# Patient Record
Sex: Female | Born: 1935 | Race: Black or African American | Hispanic: No | Smoking: Never smoker
Health system: Southern US, Community
[De-identification: ages and names within clinical notes are randomized; demographics above are authoritative.]

## PROBLEM LIST (undated history)

## (undated) DIAGNOSIS — F0390 Unspecified dementia without behavioral disturbance: Secondary | ICD-10-CM

## (undated) DIAGNOSIS — H1010 Acute atopic conjunctivitis, unspecified eye: Secondary | ICD-10-CM

## (undated) DIAGNOSIS — F99 Mental disorder, not otherwise specified: Secondary | ICD-10-CM

## (undated) DIAGNOSIS — I10 Essential (primary) hypertension: Secondary | ICD-10-CM

## (undated) HISTORY — PX: GLAUCOMA SURGERY: SHX656

## (undated) HISTORY — DX: Mental disorder, not otherwise specified: F99

## (undated) HISTORY — DX: Unspecified dementia, unspecified severity, without behavioral disturbance, psychotic disturbance, mood disturbance, and anxiety: F03.90

## (undated) HISTORY — DX: Essential (primary) hypertension: I10

## (undated) HISTORY — PX: OTHER SURGICAL HISTORY: SHX169

## (undated) HISTORY — DX: Acute atopic conjunctivitis, unspecified eye: H10.10

## (undated) HISTORY — PX: TOTAL ABDOMINAL HYSTERECTOMY W/ BILATERAL SALPINGOOPHORECTOMY: SHX83

---

## 2001-08-28 ENCOUNTER — Encounter: Payer: Self-pay | Admitting: *Deleted

## 2001-08-28 ENCOUNTER — Ambulatory Visit (HOSPITAL_COMMUNITY): Admission: RE | Admit: 2001-08-28 | Discharge: 2001-08-29 | Payer: Self-pay | Admitting: *Deleted

## 2005-07-08 ENCOUNTER — Encounter: Admission: RE | Admit: 2005-07-08 | Discharge: 2005-07-08 | Payer: Self-pay | Admitting: Family Medicine

## 2005-07-21 ENCOUNTER — Ambulatory Visit (HOSPITAL_COMMUNITY): Admission: RE | Admit: 2005-07-21 | Discharge: 2005-07-21 | Payer: Self-pay | Admitting: Family Medicine

## 2005-08-15 ENCOUNTER — Ambulatory Visit: Payer: Self-pay | Admitting: Critical Care Medicine

## 2005-08-17 ENCOUNTER — Ambulatory Visit: Payer: Self-pay | Admitting: Critical Care Medicine

## 2005-09-20 ENCOUNTER — Ambulatory Visit: Payer: Self-pay | Admitting: Critical Care Medicine

## 2005-09-21 ENCOUNTER — Ambulatory Visit: Payer: Self-pay | Admitting: Internal Medicine

## 2005-10-04 ENCOUNTER — Ambulatory Visit: Payer: Self-pay | Admitting: Critical Care Medicine

## 2005-10-19 ENCOUNTER — Ambulatory Visit: Payer: Self-pay | Admitting: Internal Medicine

## 2005-11-03 ENCOUNTER — Ambulatory Visit: Payer: Self-pay | Admitting: Internal Medicine

## 2005-11-21 ENCOUNTER — Ambulatory Visit: Payer: Self-pay | Admitting: Critical Care Medicine

## 2005-12-02 ENCOUNTER — Ambulatory Visit: Payer: Self-pay | Admitting: Pulmonary Disease

## 2006-01-04 ENCOUNTER — Ambulatory Visit: Payer: Self-pay | Admitting: Critical Care Medicine

## 2006-01-17 ENCOUNTER — Ambulatory Visit: Payer: Self-pay | Admitting: Pulmonary Disease

## 2006-01-23 ENCOUNTER — Ambulatory Visit: Payer: Self-pay | Admitting: Pulmonary Disease

## 2006-02-03 ENCOUNTER — Ambulatory Visit: Payer: Self-pay | Admitting: Pulmonary Disease

## 2006-03-03 ENCOUNTER — Ambulatory Visit: Payer: Self-pay | Admitting: Pulmonary Disease

## 2006-03-09 ENCOUNTER — Ambulatory Visit: Payer: Self-pay | Admitting: Critical Care Medicine

## 2006-04-04 ENCOUNTER — Ambulatory Visit: Payer: Self-pay | Admitting: Internal Medicine

## 2006-05-04 ENCOUNTER — Ambulatory Visit: Payer: Self-pay | Admitting: Critical Care Medicine

## 2006-06-02 ENCOUNTER — Ambulatory Visit: Payer: Self-pay | Admitting: Internal Medicine

## 2006-06-07 ENCOUNTER — Ambulatory Visit: Payer: Self-pay | Admitting: Critical Care Medicine

## 2006-07-04 ENCOUNTER — Ambulatory Visit: Payer: Self-pay | Admitting: Critical Care Medicine

## 2006-08-04 ENCOUNTER — Ambulatory Visit: Payer: Self-pay | Admitting: Critical Care Medicine

## 2006-08-15 ENCOUNTER — Ambulatory Visit: Payer: Self-pay | Admitting: Critical Care Medicine

## 2006-09-05 ENCOUNTER — Ambulatory Visit: Payer: Self-pay | Admitting: Critical Care Medicine

## 2006-10-05 ENCOUNTER — Ambulatory Visit: Payer: Self-pay | Admitting: Critical Care Medicine

## 2006-10-17 ENCOUNTER — Ambulatory Visit: Payer: Self-pay | Admitting: Critical Care Medicine

## 2006-11-02 ENCOUNTER — Ambulatory Visit: Payer: Self-pay | Admitting: Critical Care Medicine

## 2006-12-05 ENCOUNTER — Ambulatory Visit: Payer: Self-pay | Admitting: Critical Care Medicine

## 2006-12-26 ENCOUNTER — Ambulatory Visit: Payer: Self-pay | Admitting: Critical Care Medicine

## 2007-01-03 ENCOUNTER — Ambulatory Visit: Payer: Self-pay | Admitting: Internal Medicine

## 2007-01-16 ENCOUNTER — Ambulatory Visit: Payer: Self-pay | Admitting: Internal Medicine

## 2007-01-17 ENCOUNTER — Ambulatory Visit: Payer: Self-pay | Admitting: Internal Medicine

## 2007-01-19 ENCOUNTER — Ambulatory Visit: Payer: Self-pay | Admitting: Internal Medicine

## 2007-01-23 ENCOUNTER — Ambulatory Visit: Payer: Self-pay | Admitting: Internal Medicine

## 2007-01-26 ENCOUNTER — Ambulatory Visit: Payer: Self-pay | Admitting: Internal Medicine

## 2007-01-29 ENCOUNTER — Ambulatory Visit: Payer: Self-pay | Admitting: Internal Medicine

## 2007-02-02 ENCOUNTER — Ambulatory Visit: Payer: Self-pay | Admitting: Internal Medicine

## 2007-02-05 ENCOUNTER — Ambulatory Visit: Payer: Self-pay | Admitting: Internal Medicine

## 2007-02-08 ENCOUNTER — Ambulatory Visit: Payer: Self-pay | Admitting: Internal Medicine

## 2007-02-12 ENCOUNTER — Ambulatory Visit: Payer: Self-pay | Admitting: Internal Medicine

## 2007-02-15 ENCOUNTER — Ambulatory Visit: Payer: Self-pay | Admitting: Internal Medicine

## 2007-02-16 ENCOUNTER — Ambulatory Visit: Payer: Self-pay | Admitting: Internal Medicine

## 2007-02-19 ENCOUNTER — Ambulatory Visit: Payer: Self-pay | Admitting: Internal Medicine

## 2007-02-23 ENCOUNTER — Ambulatory Visit: Payer: Self-pay | Admitting: Internal Medicine

## 2007-02-26 ENCOUNTER — Ambulatory Visit: Payer: Self-pay | Admitting: Internal Medicine

## 2007-03-02 ENCOUNTER — Ambulatory Visit: Payer: Self-pay | Admitting: Internal Medicine

## 2007-03-05 ENCOUNTER — Ambulatory Visit: Payer: Self-pay | Admitting: Internal Medicine

## 2007-03-08 ENCOUNTER — Ambulatory Visit: Payer: Self-pay | Admitting: Internal Medicine

## 2007-03-12 ENCOUNTER — Ambulatory Visit: Payer: Self-pay | Admitting: Internal Medicine

## 2007-03-13 ENCOUNTER — Ambulatory Visit: Payer: Self-pay | Admitting: Internal Medicine

## 2007-03-15 ENCOUNTER — Ambulatory Visit: Payer: Self-pay | Admitting: Internal Medicine

## 2007-03-19 ENCOUNTER — Ambulatory Visit: Payer: Self-pay | Admitting: Internal Medicine

## 2007-03-22 ENCOUNTER — Ambulatory Visit: Payer: Self-pay | Admitting: Internal Medicine

## 2007-03-26 ENCOUNTER — Ambulatory Visit: Payer: Self-pay | Admitting: Internal Medicine

## 2007-03-29 ENCOUNTER — Ambulatory Visit: Payer: Self-pay | Admitting: Internal Medicine

## 2007-04-02 ENCOUNTER — Ambulatory Visit: Payer: Self-pay | Admitting: Internal Medicine

## 2007-04-06 ENCOUNTER — Ambulatory Visit: Payer: Self-pay | Admitting: Internal Medicine

## 2007-04-09 ENCOUNTER — Ambulatory Visit: Payer: Self-pay | Admitting: Internal Medicine

## 2007-04-13 ENCOUNTER — Ambulatory Visit: Payer: Self-pay | Admitting: Internal Medicine

## 2007-04-16 ENCOUNTER — Ambulatory Visit: Payer: Self-pay | Admitting: Internal Medicine

## 2007-04-19 ENCOUNTER — Ambulatory Visit: Payer: Self-pay | Admitting: Internal Medicine

## 2007-04-23 ENCOUNTER — Ambulatory Visit: Payer: Self-pay | Admitting: Internal Medicine

## 2007-04-27 ENCOUNTER — Ambulatory Visit: Payer: Self-pay | Admitting: Internal Medicine

## 2007-04-30 ENCOUNTER — Ambulatory Visit: Payer: Self-pay | Admitting: Internal Medicine

## 2007-05-01 ENCOUNTER — Ambulatory Visit: Payer: Self-pay | Admitting: Internal Medicine

## 2007-05-03 ENCOUNTER — Ambulatory Visit: Payer: Self-pay | Admitting: Internal Medicine

## 2007-05-07 ENCOUNTER — Ambulatory Visit: Payer: Self-pay | Admitting: Internal Medicine

## 2007-05-11 ENCOUNTER — Ambulatory Visit: Payer: Self-pay | Admitting: Internal Medicine

## 2007-05-15 ENCOUNTER — Ambulatory Visit: Payer: Self-pay | Admitting: Internal Medicine

## 2007-05-18 ENCOUNTER — Ambulatory Visit: Payer: Self-pay | Admitting: Internal Medicine

## 2007-05-25 ENCOUNTER — Ambulatory Visit: Payer: Self-pay | Admitting: Internal Medicine

## 2007-06-01 ENCOUNTER — Ambulatory Visit: Payer: Self-pay | Admitting: Internal Medicine

## 2007-06-08 ENCOUNTER — Ambulatory Visit: Payer: Self-pay | Admitting: Internal Medicine

## 2007-06-15 ENCOUNTER — Ambulatory Visit: Payer: Self-pay | Admitting: Internal Medicine

## 2007-06-21 ENCOUNTER — Ambulatory Visit: Payer: Self-pay | Admitting: Internal Medicine

## 2007-06-29 ENCOUNTER — Ambulatory Visit: Payer: Self-pay | Admitting: Internal Medicine

## 2007-07-06 ENCOUNTER — Ambulatory Visit: Payer: Self-pay | Admitting: Internal Medicine

## 2007-07-13 ENCOUNTER — Ambulatory Visit: Payer: Self-pay | Admitting: Internal Medicine

## 2007-07-20 ENCOUNTER — Ambulatory Visit: Payer: Self-pay | Admitting: Internal Medicine

## 2007-07-27 ENCOUNTER — Ambulatory Visit: Payer: Self-pay | Admitting: Internal Medicine

## 2007-08-03 ENCOUNTER — Ambulatory Visit: Payer: Self-pay | Admitting: Internal Medicine

## 2007-08-10 ENCOUNTER — Ambulatory Visit: Payer: Self-pay | Admitting: Internal Medicine

## 2007-08-17 ENCOUNTER — Ambulatory Visit: Payer: Self-pay | Admitting: Internal Medicine

## 2007-08-24 ENCOUNTER — Ambulatory Visit: Payer: Self-pay | Admitting: Internal Medicine

## 2007-08-31 ENCOUNTER — Ambulatory Visit: Payer: Self-pay | Admitting: Internal Medicine

## 2007-09-01 DIAGNOSIS — J45998 Other asthma: Secondary | ICD-10-CM | POA: Insufficient documentation

## 2007-09-01 DIAGNOSIS — I10 Essential (primary) hypertension: Secondary | ICD-10-CM | POA: Insufficient documentation

## 2007-09-03 ENCOUNTER — Ambulatory Visit: Payer: Self-pay | Admitting: Internal Medicine

## 2007-09-07 ENCOUNTER — Ambulatory Visit: Payer: Self-pay | Admitting: Internal Medicine

## 2007-09-11 ENCOUNTER — Ambulatory Visit: Payer: Self-pay | Admitting: Internal Medicine

## 2007-09-14 ENCOUNTER — Ambulatory Visit: Payer: Self-pay | Admitting: Internal Medicine

## 2007-09-21 ENCOUNTER — Ambulatory Visit: Payer: Self-pay | Admitting: Internal Medicine

## 2007-09-28 ENCOUNTER — Ambulatory Visit: Payer: Self-pay | Admitting: Internal Medicine

## 2007-10-05 ENCOUNTER — Ambulatory Visit: Payer: Self-pay | Admitting: Internal Medicine

## 2007-10-12 ENCOUNTER — Ambulatory Visit: Payer: Self-pay | Admitting: Internal Medicine

## 2007-10-19 ENCOUNTER — Ambulatory Visit: Payer: Self-pay | Admitting: Internal Medicine

## 2007-10-26 ENCOUNTER — Ambulatory Visit: Payer: Self-pay | Admitting: Internal Medicine

## 2007-11-02 ENCOUNTER — Ambulatory Visit: Payer: Self-pay | Admitting: Internal Medicine

## 2007-11-09 ENCOUNTER — Ambulatory Visit: Payer: Self-pay | Admitting: Internal Medicine

## 2007-11-16 ENCOUNTER — Ambulatory Visit: Payer: Self-pay | Admitting: Internal Medicine

## 2007-11-23 ENCOUNTER — Ambulatory Visit: Payer: Self-pay | Admitting: Internal Medicine

## 2007-11-30 ENCOUNTER — Ambulatory Visit: Payer: Self-pay | Admitting: Internal Medicine

## 2007-12-07 ENCOUNTER — Ambulatory Visit: Payer: Self-pay | Admitting: Internal Medicine

## 2007-12-14 ENCOUNTER — Ambulatory Visit: Payer: Self-pay | Admitting: Internal Medicine

## 2007-12-21 ENCOUNTER — Ambulatory Visit: Payer: Self-pay | Admitting: Internal Medicine

## 2007-12-28 ENCOUNTER — Ambulatory Visit: Payer: Self-pay | Admitting: Internal Medicine

## 2008-01-04 ENCOUNTER — Ambulatory Visit: Payer: Self-pay | Admitting: Internal Medicine

## 2008-01-11 ENCOUNTER — Ambulatory Visit: Payer: Self-pay | Admitting: Internal Medicine

## 2008-01-21 ENCOUNTER — Ambulatory Visit: Payer: Self-pay | Admitting: Internal Medicine

## 2008-02-01 ENCOUNTER — Ambulatory Visit: Payer: Self-pay | Admitting: Internal Medicine

## 2008-02-07 ENCOUNTER — Ambulatory Visit: Payer: Self-pay | Admitting: Internal Medicine

## 2008-02-07 DIAGNOSIS — J310 Chronic rhinitis: Secondary | ICD-10-CM | POA: Insufficient documentation

## 2008-02-15 ENCOUNTER — Ambulatory Visit: Payer: Self-pay | Admitting: Internal Medicine

## 2008-02-20 ENCOUNTER — Telehealth (INDEPENDENT_AMBULATORY_CARE_PROVIDER_SITE_OTHER): Payer: Self-pay | Admitting: *Deleted

## 2008-02-22 ENCOUNTER — Ambulatory Visit: Payer: Self-pay | Admitting: Internal Medicine

## 2008-02-29 ENCOUNTER — Ambulatory Visit: Payer: Self-pay | Admitting: Internal Medicine

## 2008-03-07 ENCOUNTER — Ambulatory Visit: Payer: Self-pay | Admitting: Internal Medicine

## 2008-03-11 ENCOUNTER — Ambulatory Visit: Payer: Self-pay | Admitting: Internal Medicine

## 2008-03-11 DIAGNOSIS — H1045 Other chronic allergic conjunctivitis: Secondary | ICD-10-CM

## 2008-03-14 ENCOUNTER — Ambulatory Visit: Payer: Self-pay | Admitting: Internal Medicine

## 2008-03-21 ENCOUNTER — Ambulatory Visit: Payer: Self-pay | Admitting: Internal Medicine

## 2008-03-24 DIAGNOSIS — J309 Allergic rhinitis, unspecified: Secondary | ICD-10-CM | POA: Insufficient documentation

## 2008-03-27 ENCOUNTER — Ambulatory Visit: Payer: Self-pay | Admitting: Internal Medicine

## 2008-04-04 ENCOUNTER — Ambulatory Visit: Payer: Self-pay | Admitting: Internal Medicine

## 2008-04-11 ENCOUNTER — Ambulatory Visit: Payer: Self-pay | Admitting: Internal Medicine

## 2008-04-18 ENCOUNTER — Ambulatory Visit: Payer: Self-pay | Admitting: Internal Medicine

## 2008-04-25 ENCOUNTER — Ambulatory Visit: Payer: Self-pay | Admitting: Internal Medicine

## 2008-05-02 ENCOUNTER — Ambulatory Visit: Payer: Self-pay | Admitting: Internal Medicine

## 2008-05-09 ENCOUNTER — Ambulatory Visit: Payer: Self-pay | Admitting: Internal Medicine

## 2008-05-13 ENCOUNTER — Ambulatory Visit: Payer: Self-pay | Admitting: Internal Medicine

## 2008-05-16 ENCOUNTER — Ambulatory Visit: Payer: Self-pay | Admitting: Internal Medicine

## 2008-05-23 ENCOUNTER — Ambulatory Visit: Payer: Self-pay | Admitting: Internal Medicine

## 2008-05-27 ENCOUNTER — Ambulatory Visit: Payer: Self-pay | Admitting: Internal Medicine

## 2008-05-30 ENCOUNTER — Ambulatory Visit: Payer: Self-pay | Admitting: Internal Medicine

## 2008-06-02 ENCOUNTER — Ambulatory Visit: Payer: Self-pay | Admitting: Internal Medicine

## 2008-06-06 ENCOUNTER — Ambulatory Visit: Payer: Self-pay | Admitting: Internal Medicine

## 2008-06-13 ENCOUNTER — Ambulatory Visit: Payer: Self-pay | Admitting: Internal Medicine

## 2008-06-19 ENCOUNTER — Ambulatory Visit: Payer: Self-pay | Admitting: Internal Medicine

## 2008-06-27 ENCOUNTER — Ambulatory Visit: Payer: Self-pay | Admitting: Internal Medicine

## 2008-07-04 ENCOUNTER — Ambulatory Visit: Payer: Self-pay | Admitting: Internal Medicine

## 2008-07-11 ENCOUNTER — Ambulatory Visit: Payer: Self-pay | Admitting: Internal Medicine

## 2008-07-18 ENCOUNTER — Ambulatory Visit: Payer: Self-pay | Admitting: Internal Medicine

## 2008-07-25 ENCOUNTER — Ambulatory Visit: Payer: Self-pay | Admitting: Internal Medicine

## 2008-08-01 ENCOUNTER — Ambulatory Visit: Payer: Self-pay | Admitting: Internal Medicine

## 2008-08-08 ENCOUNTER — Ambulatory Visit: Payer: Self-pay | Admitting: Internal Medicine

## 2008-08-12 ENCOUNTER — Ambulatory Visit: Payer: Self-pay | Admitting: Internal Medicine

## 2008-08-18 ENCOUNTER — Ambulatory Visit: Payer: Self-pay | Admitting: Internal Medicine

## 2008-08-22 ENCOUNTER — Ambulatory Visit: Payer: Self-pay | Admitting: Internal Medicine

## 2008-08-29 ENCOUNTER — Ambulatory Visit: Payer: Self-pay | Admitting: Internal Medicine

## 2008-09-05 ENCOUNTER — Ambulatory Visit: Payer: Self-pay | Admitting: Pulmonary Disease

## 2008-09-05 ENCOUNTER — Ambulatory Visit: Payer: Self-pay | Admitting: Internal Medicine

## 2008-09-12 ENCOUNTER — Ambulatory Visit: Payer: Self-pay | Admitting: Internal Medicine

## 2008-09-19 ENCOUNTER — Ambulatory Visit: Payer: Self-pay | Admitting: Internal Medicine

## 2008-09-26 ENCOUNTER — Ambulatory Visit: Payer: Self-pay | Admitting: Internal Medicine

## 2008-10-03 ENCOUNTER — Ambulatory Visit: Payer: Self-pay | Admitting: Internal Medicine

## 2008-10-06 ENCOUNTER — Ambulatory Visit: Payer: Self-pay | Admitting: Internal Medicine

## 2008-10-10 ENCOUNTER — Ambulatory Visit: Payer: Self-pay | Admitting: Internal Medicine

## 2008-10-17 ENCOUNTER — Ambulatory Visit: Payer: Self-pay | Admitting: Internal Medicine

## 2008-10-24 ENCOUNTER — Ambulatory Visit: Payer: Self-pay | Admitting: Internal Medicine

## 2008-10-31 ENCOUNTER — Ambulatory Visit: Payer: Self-pay | Admitting: Internal Medicine

## 2008-11-07 ENCOUNTER — Ambulatory Visit: Payer: Self-pay | Admitting: Internal Medicine

## 2008-11-14 ENCOUNTER — Ambulatory Visit: Payer: Self-pay | Admitting: Internal Medicine

## 2008-11-21 ENCOUNTER — Ambulatory Visit: Payer: Self-pay | Admitting: Internal Medicine

## 2008-11-28 ENCOUNTER — Ambulatory Visit: Payer: Self-pay | Admitting: Internal Medicine

## 2008-12-05 ENCOUNTER — Ambulatory Visit: Payer: Self-pay | Admitting: Internal Medicine

## 2008-12-15 ENCOUNTER — Ambulatory Visit: Payer: Self-pay | Admitting: Internal Medicine

## 2008-12-26 ENCOUNTER — Ambulatory Visit: Payer: Self-pay | Admitting: Internal Medicine

## 2009-01-02 ENCOUNTER — Ambulatory Visit: Payer: Self-pay | Admitting: Internal Medicine

## 2009-01-09 ENCOUNTER — Ambulatory Visit: Payer: Self-pay | Admitting: Internal Medicine

## 2009-01-16 ENCOUNTER — Ambulatory Visit: Payer: Self-pay | Admitting: Internal Medicine

## 2009-01-26 ENCOUNTER — Ambulatory Visit: Payer: Self-pay | Admitting: Internal Medicine

## 2009-02-06 ENCOUNTER — Ambulatory Visit: Payer: Self-pay | Admitting: Internal Medicine

## 2009-02-13 ENCOUNTER — Ambulatory Visit: Payer: Self-pay | Admitting: Internal Medicine

## 2009-02-20 ENCOUNTER — Ambulatory Visit: Payer: Self-pay | Admitting: Internal Medicine

## 2009-02-27 ENCOUNTER — Ambulatory Visit: Payer: Self-pay | Admitting: Internal Medicine

## 2009-03-06 ENCOUNTER — Ambulatory Visit: Payer: Self-pay | Admitting: Internal Medicine

## 2009-03-13 ENCOUNTER — Ambulatory Visit: Payer: Self-pay | Admitting: Internal Medicine

## 2009-03-27 ENCOUNTER — Ambulatory Visit: Payer: Self-pay | Admitting: Internal Medicine

## 2009-04-03 ENCOUNTER — Ambulatory Visit: Payer: Self-pay | Admitting: Internal Medicine

## 2009-04-10 ENCOUNTER — Ambulatory Visit: Payer: Self-pay | Admitting: Internal Medicine

## 2009-04-17 ENCOUNTER — Ambulatory Visit: Payer: Self-pay | Admitting: Internal Medicine

## 2009-04-24 ENCOUNTER — Ambulatory Visit: Payer: Self-pay | Admitting: Internal Medicine

## 2009-05-01 ENCOUNTER — Ambulatory Visit: Payer: Self-pay | Admitting: Internal Medicine

## 2009-05-08 ENCOUNTER — Ambulatory Visit: Payer: Self-pay | Admitting: Internal Medicine

## 2009-05-15 ENCOUNTER — Ambulatory Visit: Payer: Self-pay | Admitting: Internal Medicine

## 2009-05-22 ENCOUNTER — Ambulatory Visit: Payer: Self-pay | Admitting: Internal Medicine

## 2009-05-29 ENCOUNTER — Ambulatory Visit: Payer: Self-pay | Admitting: Internal Medicine

## 2009-06-05 ENCOUNTER — Ambulatory Visit: Payer: Self-pay | Admitting: Internal Medicine

## 2009-06-12 ENCOUNTER — Ambulatory Visit: Payer: Self-pay | Admitting: Internal Medicine

## 2009-06-19 ENCOUNTER — Ambulatory Visit: Payer: Self-pay | Admitting: Internal Medicine

## 2009-06-26 ENCOUNTER — Ambulatory Visit: Payer: Self-pay | Admitting: Internal Medicine

## 2009-07-03 ENCOUNTER — Ambulatory Visit: Payer: Self-pay | Admitting: Internal Medicine

## 2009-07-10 ENCOUNTER — Ambulatory Visit: Payer: Self-pay | Admitting: Internal Medicine

## 2009-07-17 ENCOUNTER — Ambulatory Visit: Payer: Self-pay | Admitting: Internal Medicine

## 2009-07-24 ENCOUNTER — Ambulatory Visit: Payer: Self-pay | Admitting: Internal Medicine

## 2009-07-31 ENCOUNTER — Ambulatory Visit: Payer: Self-pay | Admitting: Internal Medicine

## 2009-08-07 ENCOUNTER — Ambulatory Visit: Payer: Self-pay | Admitting: Internal Medicine

## 2009-08-13 ENCOUNTER — Ambulatory Visit: Payer: Self-pay | Admitting: Internal Medicine

## 2009-08-21 ENCOUNTER — Ambulatory Visit: Payer: Self-pay | Admitting: Internal Medicine

## 2009-08-28 ENCOUNTER — Ambulatory Visit: Payer: Self-pay | Admitting: Internal Medicine

## 2009-09-04 ENCOUNTER — Ambulatory Visit: Payer: Self-pay | Admitting: Internal Medicine

## 2009-09-11 ENCOUNTER — Ambulatory Visit: Payer: Self-pay | Admitting: Internal Medicine

## 2009-09-11 ENCOUNTER — Encounter (INDEPENDENT_AMBULATORY_CARE_PROVIDER_SITE_OTHER): Payer: Self-pay | Admitting: *Deleted

## 2009-09-18 ENCOUNTER — Ambulatory Visit: Payer: Self-pay | Admitting: Internal Medicine

## 2009-09-25 ENCOUNTER — Ambulatory Visit: Payer: Self-pay | Admitting: Internal Medicine

## 2009-10-02 ENCOUNTER — Ambulatory Visit: Payer: Self-pay | Admitting: Internal Medicine

## 2009-10-09 ENCOUNTER — Ambulatory Visit: Payer: Self-pay | Admitting: Internal Medicine

## 2009-10-16 ENCOUNTER — Ambulatory Visit: Payer: Self-pay | Admitting: Internal Medicine

## 2009-10-23 ENCOUNTER — Ambulatory Visit: Payer: Self-pay | Admitting: Internal Medicine

## 2009-10-30 ENCOUNTER — Ambulatory Visit: Payer: Self-pay | Admitting: Internal Medicine

## 2009-11-02 ENCOUNTER — Ambulatory Visit: Payer: Self-pay | Admitting: Internal Medicine

## 2009-11-06 ENCOUNTER — Ambulatory Visit: Payer: Self-pay | Admitting: Internal Medicine

## 2009-11-13 ENCOUNTER — Ambulatory Visit: Payer: Self-pay | Admitting: Internal Medicine

## 2009-11-20 ENCOUNTER — Ambulatory Visit: Payer: Self-pay | Admitting: Internal Medicine

## 2009-11-27 ENCOUNTER — Ambulatory Visit: Payer: Self-pay | Admitting: Internal Medicine

## 2009-12-10 ENCOUNTER — Ambulatory Visit: Payer: Self-pay | Admitting: Internal Medicine

## 2009-12-24 ENCOUNTER — Ambulatory Visit: Payer: Self-pay | Admitting: Internal Medicine

## 2010-01-01 ENCOUNTER — Ambulatory Visit: Payer: Self-pay | Admitting: Internal Medicine

## 2010-01-08 ENCOUNTER — Ambulatory Visit: Payer: Self-pay | Admitting: Internal Medicine

## 2010-01-15 ENCOUNTER — Ambulatory Visit: Payer: Self-pay | Admitting: Internal Medicine

## 2010-01-25 ENCOUNTER — Ambulatory Visit: Payer: Self-pay | Admitting: Internal Medicine

## 2010-02-05 ENCOUNTER — Ambulatory Visit: Payer: Self-pay | Admitting: Internal Medicine

## 2010-02-09 ENCOUNTER — Ambulatory Visit: Payer: Self-pay | Admitting: Internal Medicine

## 2010-02-12 ENCOUNTER — Ambulatory Visit: Payer: Self-pay | Admitting: Internal Medicine

## 2010-02-19 ENCOUNTER — Ambulatory Visit: Payer: Self-pay | Admitting: Internal Medicine

## 2010-02-26 ENCOUNTER — Ambulatory Visit: Payer: Self-pay | Admitting: Internal Medicine

## 2010-03-05 ENCOUNTER — Ambulatory Visit: Payer: Self-pay | Admitting: Internal Medicine

## 2010-03-12 ENCOUNTER — Ambulatory Visit: Payer: Self-pay | Admitting: Internal Medicine

## 2010-03-19 ENCOUNTER — Ambulatory Visit: Payer: Self-pay | Admitting: Internal Medicine

## 2010-03-26 ENCOUNTER — Ambulatory Visit: Payer: Self-pay | Admitting: Internal Medicine

## 2010-03-29 ENCOUNTER — Ambulatory Visit: Payer: Self-pay | Admitting: Internal Medicine

## 2010-04-02 ENCOUNTER — Ambulatory Visit: Payer: Self-pay | Admitting: Internal Medicine

## 2010-04-08 ENCOUNTER — Ambulatory Visit: Payer: Self-pay | Admitting: Internal Medicine

## 2010-04-16 ENCOUNTER — Ambulatory Visit: Payer: Self-pay | Admitting: Internal Medicine

## 2010-04-23 ENCOUNTER — Ambulatory Visit: Payer: Self-pay | Admitting: Internal Medicine

## 2010-04-30 ENCOUNTER — Ambulatory Visit: Payer: Self-pay | Admitting: Internal Medicine

## 2010-05-07 ENCOUNTER — Ambulatory Visit: Payer: Self-pay | Admitting: Internal Medicine

## 2010-05-14 ENCOUNTER — Ambulatory Visit: Payer: Self-pay | Admitting: Internal Medicine

## 2010-05-21 ENCOUNTER — Ambulatory Visit: Payer: Self-pay | Admitting: Internal Medicine

## 2010-05-28 ENCOUNTER — Ambulatory Visit: Payer: Self-pay | Admitting: Internal Medicine

## 2010-06-04 ENCOUNTER — Ambulatory Visit: Payer: Self-pay | Admitting: Internal Medicine

## 2010-06-11 ENCOUNTER — Ambulatory Visit: Payer: Self-pay | Admitting: Internal Medicine

## 2010-06-18 ENCOUNTER — Ambulatory Visit: Payer: Self-pay | Admitting: Internal Medicine

## 2010-06-25 ENCOUNTER — Ambulatory Visit: Payer: Self-pay | Admitting: Internal Medicine

## 2010-07-02 ENCOUNTER — Ambulatory Visit: Payer: Self-pay | Admitting: Internal Medicine

## 2010-07-09 ENCOUNTER — Ambulatory Visit: Payer: Self-pay | Admitting: Internal Medicine

## 2010-07-16 ENCOUNTER — Ambulatory Visit: Payer: Self-pay | Admitting: Internal Medicine

## 2010-07-23 ENCOUNTER — Ambulatory Visit: Payer: Self-pay | Admitting: Internal Medicine

## 2010-07-30 ENCOUNTER — Ambulatory Visit: Payer: Self-pay | Admitting: Internal Medicine

## 2010-08-06 ENCOUNTER — Ambulatory Visit: Payer: Self-pay | Admitting: Internal Medicine

## 2010-08-10 ENCOUNTER — Ambulatory Visit: Payer: Self-pay | Admitting: Internal Medicine

## 2010-08-13 ENCOUNTER — Ambulatory Visit: Payer: Self-pay | Admitting: Internal Medicine

## 2010-08-20 ENCOUNTER — Ambulatory Visit: Payer: Self-pay | Admitting: Internal Medicine

## 2010-08-27 ENCOUNTER — Ambulatory Visit: Payer: Self-pay | Admitting: Internal Medicine

## 2010-08-27 ENCOUNTER — Ambulatory Visit: Payer: Self-pay | Admitting: Pulmonary Disease

## 2010-09-03 ENCOUNTER — Ambulatory Visit: Payer: Self-pay | Admitting: Internal Medicine

## 2010-09-10 ENCOUNTER — Ambulatory Visit: Payer: Self-pay | Admitting: Internal Medicine

## 2010-09-16 ENCOUNTER — Ambulatory Visit: Payer: Self-pay | Admitting: Internal Medicine

## 2010-09-17 ENCOUNTER — Ambulatory Visit: Payer: Self-pay | Admitting: Internal Medicine

## 2010-09-24 ENCOUNTER — Ambulatory Visit: Payer: Self-pay | Admitting: Internal Medicine

## 2010-10-01 ENCOUNTER — Ambulatory Visit: Payer: Self-pay | Admitting: Internal Medicine

## 2010-10-08 ENCOUNTER — Telehealth (INDEPENDENT_AMBULATORY_CARE_PROVIDER_SITE_OTHER): Payer: Self-pay | Admitting: *Deleted

## 2010-10-08 ENCOUNTER — Ambulatory Visit: Payer: Self-pay | Admitting: Internal Medicine

## 2010-10-15 ENCOUNTER — Ambulatory Visit: Payer: Self-pay | Admitting: Internal Medicine

## 2010-10-22 ENCOUNTER — Ambulatory Visit: Payer: Self-pay | Admitting: Internal Medicine

## 2010-10-29 ENCOUNTER — Ambulatory Visit: Payer: Self-pay | Admitting: Internal Medicine

## 2010-11-08 ENCOUNTER — Ambulatory Visit: Payer: Self-pay | Admitting: Internal Medicine

## 2010-11-12 ENCOUNTER — Ambulatory Visit: Payer: Self-pay | Admitting: Internal Medicine

## 2010-11-19 ENCOUNTER — Ambulatory Visit: Payer: Self-pay | Admitting: Internal Medicine

## 2010-12-03 ENCOUNTER — Ambulatory Visit: Payer: Self-pay | Admitting: Internal Medicine

## 2010-12-17 ENCOUNTER — Ambulatory Visit: Payer: Self-pay | Admitting: Internal Medicine

## 2010-12-21 ENCOUNTER — Ambulatory Visit: Payer: Self-pay | Admitting: Internal Medicine

## 2011-01-01 ENCOUNTER — Ambulatory Visit: Payer: Self-pay | Admitting: Internal Medicine

## 2011-01-07 ENCOUNTER — Ambulatory Visit: Payer: Self-pay | Admitting: Internal Medicine

## 2011-01-13 ENCOUNTER — Ambulatory Visit: Payer: Self-pay | Admitting: Internal Medicine

## 2011-01-16 ENCOUNTER — Ambulatory Visit: Payer: Self-pay | Admitting: Internal Medicine

## 2011-01-20 NOTE — Assessment & Plan Note (Signed)
Summary: FLU SHOT/[facility]  Nurse Visit   Allergies: 1)  ! Penicillin  Orders Added: 1)  Flu Vaccine 22yrs + MEDICARE PATIENTS [Q2039] 2)  Administration Flu vaccine - MCR [G0008] Flu Vaccine Consent Questions     Do you have a history of severe allergic reactions to this vaccine? no    Any prior history of allergic reactions to egg and/or gelatin? no    Do you have a sensitivity to the preservative Thimersol? no    Do you have a past history of Guillan-Barre Syndrome? no    Do you currently have an acute febrile illness? no    Have you ever had a severe reaction to latex? no    Vaccine information given and explained to patient? yes    Are you currently pregnant? no    Lot Number:[id number]   Exp Date:06/18/2011   Site Given  right Deltoid IM

## 2011-01-20 NOTE — Assessment & Plan Note (Signed)
Summary: ROV 2 WKS W/ CY /// KP   Primary Aurora Rody/Referring Artemus Romanoff:  Manus Gunning  CC:  2 week follow up visit-breathing bad and Increased allergy flare up.Marland Kitchen  History of Present Illness: August 10, 2010- allergic rhinitis, asthma, allergic conjunctivitis No longer aware of major difference from one season to another. She says her major complaint remains "gnat butter" mucus mainly from left eye. Wakes with left eye matted - cleans with a wet cloth. Sees Dr Dione Booze who blames allergy, but doesn't explain while mainly left eye is involved.. Treated for glaucoma, putting drops only in her right eye.. Takes daily Zyrtec but unsure if it helps. Denies burning or itching. Asthma is doing very well. Hasn't needed to use the rescue inhaler since she started the allergy shots.  September 16, 2010 --Presents for work in visit. She began new eye drops on 08-26-10 - now having red, itchy eyes with drainage and increased SOB that began 5days ago, has been having eye issues for 1 month. Seen by eye doctor. last week. Feels like breathing is not as good last few days with intermittent cough. Denies chest pain,  orthopnea, hemoptysis, fever, n/v/d, edema, headache.   October 01, 2010 ............... 2 week follow up visit-breathing bad and Increased allergy flare up. Saw NP with increased asthma and conjunctivitis in late September. Now "doing tolerable". Eye doctor appointmnet is still pending. Albuterol worked pretty well, but she is out. Notices occasional cough and wheeze. Maybe a mild cold. Nasal stuffy w/o sore throat. Morning cough productive clear mucus. No benefit from Qvar inhaler. Continues allergy vaccine at 1:10.    Asthma History    Asthma Control Assessment:    Age range: 12+ years    Symptoms: 0-2 days/week    Nighttime Awakenings: 0-2/month    Interferes w/ normal activity: no limitations    SABA use (not for EIB): 0-2 days/week    Asthma Control Assessment: Well Controlled   Preventive  Screening-Counseling & Management  Alcohol-Tobacco     Smoking Status: never  Current Medications (verified): 1)  Vytorin 10-20 Mg  Tabs (Ezetimibe-Simvastatin) .... Take 1 Tab By Mouth At Bedtime 2)  Amlodipine Besylate 5 Mg  Tabs (Amlodipine Besylate) .... Take 1 Tablet By Mouth Once A Day 3)  Toprol Xl 100 Mg  Tb24 (Metoprolol Succinate) .... Take 1 Tablet By Mouth Once A Day 4)  Hydrochlorothiazide 25 Mg  Tabs (Hydrochlorothiazide) .... Take 1 Tablet By Mouth Once A Day 5)  Nitrostat 0.4 Mg Subl (Nitroglycerin) .Marland Kitchen.. 1 Under Tongue Very 5 Minutes X3 As Needed Chest Pain 6)  Adult Aspirin Ec Low Strength 81 Mg  Tbec (Aspirin) .... Take 1 Tablet By Mouth Once A Day 7)  Multivitamins   Caps (Multiple Vitamin) .... Take 1 Tablet By Mouth Once A Day 8)  Zyrtec Allergy 10 Mg  Tabs (Cetirizine Hcl) .... Take 1 Tablet By Mouth Once A Day 9)  Allergy Vaccine 1:10 Gh .... Once A Week 10)  Proair Hfa 108 (90 Base) Mcg/act  Aers (Albuterol Sulfate) .... 2 Puffs Four Times A Day As Needed 11)  Refresh 1 % Soln (Carboxymethylcellulose Sodium) .... As Needed 12)  Trusopt 2 % Soln (Dorzolamide Hcl) .Marland Kitchen.. 1 Drop Each Eye Two Times A Day 13)  Betoptic-S 0.25 % Susp (Betaxolol Hcl) .Marland Kitchen.. 1 Drop Each Eye Two Times A Day 14)  Lumigan 0.03 % Soln (Bimatoprost) .Marland Kitchen.. 1 Drop in The Right Eye Once Daily 15)  Vitamin D 500 .... Take 1 By Mouth Once  Daily 16)  Folic Acid 800 Mcg Tabs (Folic Acid) .... Take 1 By Mouth Once Daily 17)  Lutein 20 Mg Caps (Lutein) .... Take 1 By Mouth Once Daily 18)  Potassium 99 Mg Tabs (Potassium) .... Take 1 By Mouth Once Daily 19)  Vitamin C 1000 Mg Tabs (Ascorbic Acid) .... Take 1 By Mouth Once Daily 20)  Fish Oil 300 Mg Caps (Omega-3 Fatty Acids) .... Take 1 By Mouth Once Daily 21)  Flax Seed Oil 1000 Mg Caps (Flaxseed (Linseed)) .... Take 1 By Mouth Once Daily 22)  Calcium 600 Mg Tabs (Calcium) .... Take 1 By Mouth Once Daily 23)  Vitamin E 400 Unit Caps (Vitamin E) .... Take 1  By Mouth Once Daily 24)  Bilberry Plus  Caps (Specialty Vitamins Products) .... Take 1 By Mouth Once Daily  Allergies (verified): 1)  ! 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   She is a never smoker. Retired Married with 2 children  Risk Factors: Smoking Status: never (10/01/2010)  Review of Systems      See HPI       The patient complains of shortness of breath with activity, productive cough, and nasal congestion/difficulty breathing through nose.  The patient denies non-productive cough, coughing up blood, chest pain, irregular heartbeats, acid heartburn, indigestion, loss of appetite, weight change, abdominal pain, difficulty swallowing, sore throat, tooth/dental problems, headaches, sneezing, ear ache, rash, change in color of mucus, and fever.    Vital Signs:  Patient profile:   77 year old female Height:      64 inches Weight:      137.13 pounds BMI:     23.62 O2 Sat:      99 % on Room air Pulse rate:   62 / minute BP sitting:   150 / 70  (right arm) Cuff size:   regular  Vitals Entered By: Reynaldo Minium CMA (October 01, 2010 3:25 PM)  O2 Flow:  Room air CC: 2 week follow up visit-breathing bad and Increased allergy flare up.   Physical Exam  Additional Exam:  General: A/Ox3; pleasant and cooperative, NAD, calm SKIN: no rash, lesions NODES: no lymphadenopathy HEENT: [place]/AT, EOM- WNL,, conjunctiva slightly injected bilaterally, no  discharge is noted. PERRLA, TM-WNL, Nose- clear, Throat- clear and wnl, dentures, Mallampati III NECK: Supple w/ fair ROM, JVD- none, normal carotid impulses w/o bruits  CHEST: Clear bilaterally with no wheezing. Dry cough only once while here HEART: RRR, no m/g/r heard ABDOMEN: Soft and nl;   ZOX:WRUE, nl pulses, no edema  NEURO: Grossly intact to observation      Impression & Recommendations:  Problem # 1:  ALLERGIC CONJUNCTIVITIS (ICD-372.14)  She is under active care of her eye doctor and I will leave eye meds to them.   Problem # 2:  ASTHMA (ICD-493.90) She will do best with occasional use of a rescue inhaler. I will take Qvar off list.  Problem # 3:  ALLERGIC RHINITIS (ICD-477.9) Continuing allergy vaccine for now with antihistamine if needed. Her updated medication list for  this problem includes:    Zyrtec Allergy 10 Mg Tabs (Cetirizine hcl) .Marland Kitchen... Take 1 tablet by mouth once a day  Other Orders: Est. Patient Level IV (62130)  Patient Instructions: 1)  Please schedule a follow-up appointment in 4 months. 2)  Script for Proair/ albuterol sent to your drug store 3)  You can stop using Qvar since it hasn't helped.  Prescriptions: PROAIR HFA 108 (90 BASE) MCG/ACT  AERS (ALBUTEROL SULFATE) 2 puffs four times a day as needed  #1 x prn   Entered and Authorized by:   Waymon Budge MD   Signed by:   Waymon Budge MD on 10/01/2010   Method used:   Electronically to        [facility]  [phone]* (retail)       [address]       [place], [place]  [address]       Ph: [phone] or [phone]       Fax: [phone]   RxID:   [id number]

## 2011-01-20 NOTE — Assessment & Plan Note (Signed)
Summary: ROV 6 MONTHS///KP   Primary Jeanette Montoya/Referring [name]:  Manus Gunning  CC:  6 month follow up visit-allergies. No Complaints..  History of Present Illness: 02/13/09-Allergic rhinitis, asthma, allergic conjunctivitis Good winter without special problems.Last used rescue inhaler a month ago. Got seasonal flu vac. Doesn't want to stop allergy vaccine- having no problems with it.  08/13/09- Allergic rhinitis, asthma, allergic conjunctivitis Still notices mucus from eyes "gnat butter" that she wipes away with wet cloth on first waking in AM and after yard work. She denies burning, itching or blurred vision otherwise. Little nasal itch or sneeze. Denies chest tightnes or wheeze, with rare need for her rescue inhaler.  February 09, 2010- Allergic rhinitis, asthma, allergic conjunctivitis Had an infection requiring Vigamox opthalmic antibiotic aftrer dental extraction, but now resolved. No specific complaints now, with no respiratory infections this winter. She is anticipating Spring pollen.  No reactions or problems with allergy vaccine. No recent need for Zyrtec or Proair.  August 10, 2010- allergic rhinitis, asthma, allergic conjunctivitis No longer aware of major difference from one season to another. She says her major complaint remains "gnat butter" mucus mainly from left eye. Wakes with left eye matted - cleans with a wet cloth. Sees Dr Dione Booze who blames allergy, but doesn't explain while mainly left eye is involved.. Treated for glaucoma, putting drops only in her right eye.. Takes daily Zyrtec but unsure if it helps. Denies burning or itching. Asthma is doing very well. Hasn't needed to use the rescue inhaler since she started the allergy shots.   Asthma History    Initial Asthma Severity Rating:    Age range: 12+ years    Symptoms: 0-2 days/week    Nighttime Awakenings: 0-2/month    Interferes w/ normal activity: no limitations    SABA use (not for EIB): 0-2 days/week    Asthma  Severity Assessment: Intermittent   Preventive Screening-Counseling & Management  Alcohol-Tobacco     Smoking Status: never  Current Medications (verified): 1)  Vytorin 10-20 Mg  Tabs (Ezetimibe-Simvastatin) .... One P.o. Daily 2)  Amlodipine Besylate 5 Mg  Tabs (Amlodipine Besylate) .... Take 1 Tablet By Mouth Once A Day 3)  Toprol Xl 100 Mg  Tb24 (Metoprolol Succinate) .... One P.o. Daily 4)  Hydrochlorothiazide 25 Mg  Tabs (Hydrochlorothiazide) .... Take 1 Tablet By Mouth Once A Day 5)  Nitroquick 0.4 Mg  Subl (Nitroglycerin) .... As Needed 6)  Adult Aspirin Ec Low Strength 81 Mg  Tbec (Aspirin) .... One P.o. Daily 7)  Multivitamins   Caps (Multiple Vitamin) .... Take 1 Tablet By Mouth Once A Day 8)  Zyrtec Allergy 10 Mg  Tabs (Cetirizine Hcl) .... One P.o. Daily 9)  Allergy Vaccine 1:10 Gh 10)  Proair Hfa 108 (90 Base) Mcg/act  Aers (Albuterol Sulfate) .... 2 Puffs Four Times A Day As Needed 11)  Refresh 1 % Soln (Carboxymethylcellulose Sodium) .... Use As Directed 12)  Trusopt 2 % Soln (Dorzolamide Hcl) .... Use As Directed 13)  Betoptic-S 0.25 % Susp (Betaxolol Hcl) .... Use As Directed 14)  Lumigan 0.03 % Soln (Bimatoprost) .... Use As Directed 15)  Vitamin D 500 .... Take 1 By Mouth Once Daily 16)  Folic Acid 800 Mcg Tabs (Folic Acid) .... Take 1 By Mouth Once Daily 17)  Lutein 20 Mg Caps (Lutein) .... Take 1 By Mouth Once Daily 18)  Potassium 99 Mg Tabs (Potassium) .... Take 1 By Mouth Once Daily 19)  Vitamin C 1000 Mg Tabs (Ascorbic Acid) .... Take  1 By Mouth Once Daily 20)  Fish Oil 300 Mg Caps (Omega-3 Fatty Acids) .... Take 1 By Mouth Once Daily 21)  Flax Seed Oil 1000 Mg Caps (Flaxseed (Linseed)) .... Take 1 By Mouth Once Daily 22)  Calcium 600 Mg Tabs (Calcium) .... Take 1 By Mouth Once Daily 23)  Vitamin E 400 Unit Caps (Vitamin E) .... Take 1 By Mouth Once Daily 24)  Bilberry Plus  Caps (Specialty Vitamins Products) .... Take 1 By Mouth Once Daily  Allergies  (verified): 1)  ! 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   She is a never smoker. Retired Married with 2 children  Risk Factors: Smoking Status: never (08/10/2010)  Review of Systems      See HPI  The patient denies shortness of breath with activity, shortness of breath at rest, productive cough, non-productive cough, coughing up blood, chest pain, irregular heartbeats, acid heartburn, indigestion, loss of appetite, weight change, abdominal pain, difficulty swallowing, sore throat, tooth/dental problems, headaches, nasal congestion/difficulty breathing through nose, and sneezing.    Vital Signs:  Patient profile:   77 year old female Height:      64 inches Weight:      139.13 pounds BMI:     23.97 O2 Sat:      96 % on Room air Pulse rate:   69 / minute BP sitting:   132 / 64  (right arm) Cuff size:   regular  Vitals Entered By: Reynaldo Minium CMA (August 10, 2010 10:29 AM)  O2 Flow:  Room air CC: 6 month follow up visit-allergies. No Complaints.   Physical Exam  Additional Exam:  General: A/Ox3; pleasant and cooperative, NAD,  SKIN: no rash, lesions NODES: no lymphadenopathy HEENT: [place]/AT, EOM- WNL, Conjuctivae-clear, maybe a little redness at punctum but nothing remarkable and no difference from right eye., PERRLA, TM-WNL, Nose- clear, Throat- clear and wnl, dentures, Mallampati III NECK: Supple w/ fair ROM, JVD- none, normal carotid  impulses w/o bruits Thyroid-  CHEST: Clear to P&A, quiet, exhalation is slow. HEART: RRR, no m/g/r heard ABDOMEN: Soft and nl;  WNU:UVOZ, nl pulses, no edema  NEURO: Grossly intact to observation      Impression & Recommendations:  Problem # 1:  ALLERGIC CONJUNCTIVITIS (ICD-372.14)  Dr Dione Booze attributes the mucus she complains of to "allergy". Not clear why only one side is affected, but I suspect it is because she puts glaucoma drops only in the other eye.. I don't want to interfere with her glaucoma drops. She only medicates her right eye, so she will try an otc drop just in the left eye..  Problem # 2:  ALLERGIC RHINITIS (ICD-477.9)  Good control seasonally with allergy vaccine. Her updated medication list for this problem includes:    Zyrtec Allergy 10 Mg Tabs (Cetirizine hcl) ..... One p.o. daily  Problem # 3:  ASTHMA (ICD-493.90) She is pleased with her allergy vaccine and not needing a rescue inhaler.  Other Orders: Est. Patient Level IV (36644)  Patient Instructions: 1)  Please schedule a follow-up appointment  in 6 months. 2)  continue allergy vaccine. Call as needed. 3)  Try otc eyedrops in left eye 4)  Artificial tears 5)  Allway or Visine AC as "allergy " eye drops.

## 2011-01-20 NOTE — Assessment & Plan Note (Signed)
Summary: rov 6 months///kp   Primary Provider/Referring Provider:  Manus Gunning  CC:  Pt here for follow up. Pt states no complaints.  History of Present Illness: 08/12/08- 77 year old woman returning for follow-up of allergic rhinitis, conjunctivitis, and asthma.  She gets some matter draining from her left eye, but no pain or burning.  Dr. Dione Booze tells her glaucoma has been worse and she may need surgery. Occasional rhinorrhea.  Denies problems with her ears, chest tightness, wheeze, cough.  Continues allergy vaccine at 1:10, Optivar eyedrops.  02/13/09-Allergic rhinitis, asthma, allergic conjunctivitis Good winter without special problems.Last used rescue inhaler a month ago. Got seasonal flu vac. Doesn't want to stop allergy vaccine- having no problems with it.  08/13/09- Allergic rhinitis, asthma, allergic conjunctivitis Still notices mucus from eyes "gnat butter" that she wipes away with wet cloth on first waking in AM and after yard work. She denies burning, itching or blurred vision otherwise. Little nasal itch or sneeze. Denies chest tightnes or wheeze, with rare need for her rescue inhaler.  February 09, 2010- Allergic rhinitis, asthma, allergic conjunctivitis Had an infection requiring Vigamox opthalmic antibiotic aftrer dental extraction, but now resolved. No specific complaints now, with no respiratory infections this winter. She is anticipating Spring pollen.  No reactions or problems with allergy vaccine. No recent need for Zyrtec or Proair.      Current Medications (verified): 1)  Vytorin 10-20 Mg  Tabs (Ezetimibe-Simvastatin) .... One P.o. Daily 2)  Amlodipine Besylate 5 Mg  Tabs (Amlodipine Besylate) .... Take 1 Tablet By Mouth Once A Day 3)  Toprol Xl 100 Mg  Tb24 (Metoprolol Succinate) .... One P.o. Daily 4)  Hydrochlorothiazide 25 Mg  Tabs (Hydrochlorothiazide) .... Take 1 Tablet By Mouth Once A Day 5)  Nitroquick 0.4 Mg  Subl (Nitroglycerin) .... As Needed 6)  Adult  Aspirin Ec Low Strength 81 Mg  Tbec (Aspirin) .... One P.o. Daily 7)  Multivitamins   Caps (Multiple Vitamin) .... Take 1 Tablet By Mouth Once A Day 8)  Zyrtec Allergy 10 Mg  Tabs (Cetirizine Hcl) .... One P.o. Daily 9)  Allergy Vaccine 1:10 Gh 10)  Proair Hfa 108 (90 Base) Mcg/act  Aers (Albuterol Sulfate) .... 2 Puffs Four Times A Day As Needed 11)  Refresh 1 % Soln (Carboxymethylcellulose Sodium) .... Use As Directed 12)  Trusopt 2 % Soln (Dorzolamide Hcl) .... Use As Directed 13)  Betoptic-S 0.25 % Susp (Betaxolol Hcl) .... Use As Directed 14)  Lumigan 0.03 % Soln (Bimatoprost) .... Use As Directed 15)  Vitamin D 500 .... Take 1 By Mouth Once Daily 16)  Folic Acid 800 Mcg Tabs (Folic Acid) .... Take 1 By Mouth Once Daily 17)  Lutein 20 Mg Caps (Lutein) .... Take 1 By Mouth Once Daily 18)  Potassium 99 Mg Tabs (Potassium) .... Take 1 By Mouth Once Daily 19)  Vitamin C 1000 Mg Tabs (Ascorbic Acid) .... Take 1 By Mouth Once Daily 20)  Fish Oil 300 Mg Caps (Omega-3 Fatty Acids) .... Take 1 By Mouth Once Daily 21)  Flax Seed Oil 1000 Mg Caps (Flaxseed (Linseed)) .... Take 1 By Mouth Once Daily 22)  Calcium 600 Mg Tabs (Calcium) .... Take 1 By Mouth Once Daily 23)  Vitamin E 400 Unit Caps (Vitamin E) .... Take 1 By Mouth Once Daily 24)  Bilberry Plus  Caps (Specialty Vitamins Products) .... Take 1 By Mouth Once Daily 25)  Vigamox 0.5 % Soln (Moxifloxacin Hcl) .... One Drop in Left Eye Four Times A  Day  Allergies (verified): 1)  ! 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   She is a never smoker. Retired Married with 2 children  Risk Factors: Smoking Status: never (09/01/2007)  Past Surgical History: Glaucoma surgery T A H and B S O Dental extrraction  Review of Systems      See HPI  The patient denies anorexia, fever, weight loss, weight gain, vision loss, decreased hearing, hoarseness, chest pain, syncope, dyspnea on exertion, peripheral edema, prolonged cough, headaches, hemoptysis, and severe indigestion/heartburn.    Vital Signs:  Patient profile:   77 year old female Height:      64 inches Weight:      151 pounds O2 Sat:      98 % on Room air Pulse rate:   60 / minute BP sitting:   130 / 72  (left arm) Cuff size:   regular  Vitals Entered By: Zackery Barefoot CMA (February 09, 2010 9:06 AM)  O2 Flow:  Room air CC: Pt here for follow up. Pt states no complaints Comments Medications reviewed with patient Verified contact number and pharmacy with patient Zackery Barefoot CMA  February 09, 2010 9:07 AM    Physical Exam  Additional Exam:  General: A/Ox3; pleasant and cooperative, NAD,  SKIN: no rash, lesions NODES: no lymphadenopathy HEENT: [place]/AT, EOM- WNL, Conjuctivae-clear, PERRLA, TM-WNL, Nose- clear, Throat- clear and wnl, dentures, Melampatti III NECK: Supple w/ fair ROM, JVD- none, normal carotid impulses w/o bruits Thyroid-  CHEST: Clear to P&A, quiet, exhalation is slow. HEART: RRR, no m/g/r heard ABDOMEN: Soft and nl;  ZOX:WRUE, nl pulses, no edema  NEURO: Grossly intact to observation      Impression & Recommendations:  Problem # 1:  ALLERGIC RHINITIS (ICD-477.9)  We agreed to continue her allergy vaccine, which she feels has helped her reduce need for meds. Her updated medication list for this problem includes:    Zyrtec Allergy 10 Mg Tabs (Cetirizine hcl) ..... One p.o. daily  Problem # 2:  ASTHMA (ICD-493.90) She has Proair if needed. She is currently well controlled.  Medications Added to  Medication List This Visit: 1)  Vigamox 0.5 % Soln (Moxifloxacin hcl) .... One drop in left eye four times a day  Other Orders: Est. Patient Level II (45409)  Patient Instructions: 1)  Please schedule a follow-up appointment in 6 months. 2)  Continue allergy vaccine and use your meds as needed as you have been. Please call if I can help.

## 2011-01-20 NOTE — Miscellaneous (Signed)
Summary: Injection Record/[facility]  Injection Record/[facility]   Imported By: Sherian Rein 05/11/2010 12:41:52  _____________________________________________________________________  External Attachment:    Type:   Image     Comment:   External Document

## 2011-01-20 NOTE — Miscellaneous (Signed)
Summary: Injection Record / [facility]    Injection Record / [facility]    Imported By: Lennie Odor 08/20/2010 12:10:09  _____________________________________________________________________  External Attachment:    Type:   Image     Comment:   External Document

## 2011-01-20 NOTE — Miscellaneous (Signed)
Summary: Injection [employer] [facility] [employer]   Imported By: Sherian Rein 11/10/2010 14:18:19  _____________________________________________________________________  External Attachment:    Type:   Image     Comment:   External Document

## 2011-01-20 NOTE — Assessment & Plan Note (Signed)
Summary: Acute NP office visit   Primary Provider/Referring Provider:  Ehinger  CC:  began new eye drops on 08-26-10 - now having red, itchy eyes , and drainage. Marland Kitchen  History of Present Illness: 02/13/09-Allergic rhinitis, asthma, allergic conjunctivitis Good winter without special problems.Last used rescue inhaler a month ago. Got seasonal flu vac. Doesn't want to stop allergy vaccine- having no problems with it.  08/13/09- Allergic rhinitis, asthma, allergic conjunctivitis Still notices mucus from eyes "gnat butter" that she wipes away with wet cloth on first waking in AM and after yard work. She denies burning, itching or blurred vision otherwise. Little nasal itch or sneeze. Denies chest tightnes or wheeze, with rare need for her rescue inhaler.  February 09, 2010- Allergic rhinitis, asthma, allergic conjunctivitis Had an infection requiring Vigamox opthalmic antibiotic aftrer dental extraction, but now resolved. No specific complaints now, with no respiratory infections this winter. She is anticipating Spring pollen.  No reactions or problems with allergy vaccine. No recent need for Zyrtec or Proair.  August 10, 2010- allergic rhinitis, asthma, allergic conjunctivitis No longer aware of major difference from one season to another. She says her major complaint remains "gnat butter" mucus mainly from left eye. Wakes with left eye matted - cleans with a wet cloth. Sees Dr Dione Booze who blames allergy, but doesn't explain while mainly left eye is involved.. Treated for glaucoma, putting drops only in her right eye.. Takes daily Zyrtec but unsure if it helps. Denies burning or itching. Asthma is doing very well. Hasn't needed to use the rescue inhaler since she started the allergy shots.  September 16, 2010 --Presents for work in visit. She began new eye drops on 08-26-10 - now having red, itchy eyes with drainage and increased SOB that began 5days ago, has been having eye issues for 1 month. Seen by eye  doctor. last week. Feels like breathing is not as good last few days with intermittent cough. Denies chest pain,  orthopnea, hemoptysis, fever, n/v/d, edema, headache.   Medications Prior to Update: 1)  Vytorin 10-20 Mg  Tabs (Ezetimibe-Simvastatin) .... One P.o. Daily 2)  Amlodipine Besylate 5 Mg  Tabs (Amlodipine Besylate) .... Take 1 Tablet By Mouth Once A Day 3)  Toprol Xl 100 Mg  Tb24 (Metoprolol Succinate) .... One P.o. Daily 4)  Hydrochlorothiazide 25 Mg  Tabs (Hydrochlorothiazide) .... Take 1 Tablet By Mouth Once A Day 5)  Nitroquick 0.4 Mg  Subl (Nitroglycerin) .... As Needed 6)  Adult Aspirin Ec Low Strength 81 Mg  Tbec (Aspirin) .... One P.o. Daily 7)  Multivitamins   Caps (Multiple Vitamin) .... Take 1 Tablet By Mouth Once A Day 8)  Zyrtec Allergy 10 Mg  Tabs (Cetirizine Hcl) .... One P.o. Daily 9)  Allergy Vaccine 1:10 Gh 10)  Proair Hfa 108 (90 Base) Mcg/act  Aers (Albuterol Sulfate) .... 2 Puffs Four Times A Day As Needed 11)  Refresh 1 % Soln (Carboxymethylcellulose Sodium) .... Use As Directed 12)  Trusopt 2 % Soln (Dorzolamide Hcl) .... Use As Directed 13)  Betoptic-S 0.25 % Susp (Betaxolol Hcl) .... Use As Directed 14)  Lumigan 0.03 % Soln (Bimatoprost) .... Use As Directed 15)  Vitamin D 500 .... Take 1 By Mouth Once Daily 16)  Folic Acid 800 Mcg Tabs (Folic Acid) .... Take 1 By Mouth Once Daily 17)  Lutein 20 Mg Caps (Lutein) .... Take 1 By Mouth Once Daily 18)  Potassium 99 Mg Tabs (Potassium) .... Take 1 By Mouth Once Daily 19)  Vitamin C 1000 Mg Tabs (Ascorbic Acid) .... Take 1 By Mouth Once Daily 20)  Fish Oil 300 Mg Caps (Omega-3 Fatty Acids) .... Take 1 By Mouth Once Daily 21)  Flax Seed Oil 1000 Mg Caps (Flaxseed (Linseed)) .... Take 1 By Mouth Once Daily 22)  Calcium 600 Mg Tabs (Calcium) .... Take 1 By Mouth Once Daily 23)  Vitamin E 400 Unit Caps (Vitamin E) .... Take 1 By Mouth Once Daily 24)  Bilberry Plus  Caps (Specialty Vitamins Products) .... Take 1 By  Mouth Once Daily  Current Medications (verified): 1)  Vytorin 10-20 Mg  Tabs (Ezetimibe-Simvastatin) .... Take 1 Tab By Mouth At Bedtime 2)  Amlodipine Besylate 5 Mg  Tabs (Amlodipine Besylate) .... Take 1 Tablet By Mouth Once A Day 3)  Toprol Xl 100 Mg  Tb24 (Metoprolol Succinate) .... Take 1 Tablet By Mouth Once A Day 4)  Hydrochlorothiazide 25 Mg  Tabs (Hydrochlorothiazide) .... Take 1 Tablet By Mouth Once A Day 5)  Nitrostat 0.4 Mg Subl (Nitroglycerin) .Marland Kitchen.. 1 Under Tongue Very 5 Minutes X3 As Needed Chest Pain 6)  Adult Aspirin Ec Low Strength 81 Mg  Tbec (Aspirin) .... Take 1 Tablet By Mouth Once A Day 7)  Multivitamins   Caps (Multiple Vitamin) .... Take 1 Tablet By Mouth Once A Day 8)  Zyrtec Allergy 10 Mg  Tabs (Cetirizine Hcl) .... Take 1 Tablet By Mouth Once A Day 9)  Allergy Vaccine 1:10 Gh .... Once A Week 10)  Proair Hfa 108 (90 Base) Mcg/act  Aers (Albuterol Sulfate) .... 2 Puffs Four Times A Day As Needed 11)  Refresh 1 % Soln (Carboxymethylcellulose Sodium) .... As Needed 12)  Trusopt 2 % Soln (Dorzolamide Hcl) .Marland Kitchen.. 1 Drop Each Eye Two Times A Day 13)  Betoptic-S 0.25 % Susp (Betaxolol Hcl) .Marland Kitchen.. 1 Drop Each Eye Two Times A Day 14)  Lumigan 0.03 % Soln (Bimatoprost) .Marland Kitchen.. 1 Drop in The Right Eye Once Daily 15)  Vitamin D 500 .... Take 1 By Mouth Once Daily 16)  Folic Acid 800 Mcg Tabs (Folic Acid) .... Take 1 By Mouth Once Daily 17)  Lutein 20 Mg Caps (Lutein) .... Take 1 By Mouth Once Daily 18)  Potassium 99 Mg Tabs (Potassium) .... Take 1 By Mouth Once Daily 19)  Vitamin C 1000 Mg Tabs (Ascorbic Acid) .... Take 1 By Mouth Once Daily 20)  Fish Oil 300 Mg Caps (Omega-3 Fatty Acids) .... Take 1 By Mouth Once Daily 21)  Flax Seed Oil 1000 Mg Caps (Flaxseed (Linseed)) .... Take 1 By Mouth Once Daily 22)  Calcium 600 Mg Tabs (Calcium) .... Take 1 By Mouth Once Daily 23)  Vitamin E 400 Unit Caps (Vitamin E) .... Take 1 By Mouth Once Daily 24)  Bilberry Plus  Caps (Specialty  Vitamins Products) .... Take 1 By Mouth Once Daily  Allergies (verified): 1)  ! Penicillin  Past History:  Past Medical History: Last updated: 02/13/2009 Asthma Hypertension Allergic Rhinitis Allergic conjunctivitis Glaucoma  Past Surgical History: Last updated: 02/09/2010 Glaucoma surgery T A H and B S O Dental extrraction  Family History: Last updated: 05/13/2008 negative for atopy asthma or any lung disease  Mother-deceased age 30; old age Father-deceased age 12; heart attack Brother-deceased age 30 heart attack Brother- deceased age 59; heart attack  Social History: Last updated: 05/13/2008 previously she worked in city [employer].   She is a never smoker. Retired Married with 2 children  Risk Factors: Smoking Status: never (08/10/2010)  Review of Systems      See HPI  Vital Signs:  Patient profile:   77 year old female Height:      64 inches Weight:      136.13 pounds BMI:     23.45 O2 Sat:      97 % on Room air Temp:     97.5 degrees F oral Pulse rate:   62 / minute BP sitting:   142 / 64  (left arm) Cuff size:   regular  Vitals Entered By: Boone Master CNA/MA (September 16, 2010 3:19 PM)  O2 Flow:  Room air CC: began new eye drops on 08-26-10 - now having red, itchy eyes ,drainage.  Is Patient Diabetic? No Comments Medications reviewed with patient Daytime contact number verified with patient. Boone Master CNA/MA  September 16, 2010 3:14 PM    Physical Exam  Additional Exam:  General: A/Ox3; pleasant and cooperative, NAD,  SKIN: no rash, lesions NODES: no lymphadenopathy HEENT: [place]/AT, EOM- WNL,, conjunctiva slightly injected bilaterally, no discharge is noted. PERRLA, TM-WNL, Nose- clear, Throat- clear and wnl, dentures, Mallampati III NECK: Supple w/ fair ROM, JVD- none, normal carotid impulses w/o bruits  CHEST: Clear bilaterally with no wheezing.  HEART: RRR, no m/g/r heard ABDOMEN: Soft and nl;  ZOX:WRUE, nl pulses, no edema  NEURO:  Grossly intact to observation      Impression & Recommendations:  Problem # 1:  ALLERGIC CONJUNCTIVITIS (ICD-372.14)  Have recommended to follow up with eye doctor if not improving.  She is on multiple eye drops. cont as recommended for now. make ov for follow up if  eyes not improving.   Orders: Est. Patient Level III (45409)  Problem # 2:  ASTHMA (ICD-493.90) Mild flare.  tx w/ QVAR w/ 2 week sample.  xopenex as needed  betoptic was added from opthmalolgy--if asthma flare persists may need to change this eye drop.  Please contact office for sooner follow up if symptoms do not improve or worsen   Medications Added to Medication List This Visit: 1)  Vytorin 10-20 Mg Tabs (Ezetimibe-simvastatin) .... Take 1 tab by mouth at bedtime 2)  Toprol Xl 100 Mg Tb24 (Metoprolol succinate) .... Take 1 tablet by mouth once a day 3)  Nitrostat 0.4 Mg Subl (Nitroglycerin) .Marland Kitchen.. 1 under tongue very 5 minutes x3 as needed chest pain 4)  Adult Aspirin Ec Low Strength 81 Mg Tbec (Aspirin) .... Take 1 tablet by mouth once a day 5)  Zyrtec Allergy 10 Mg Tabs (Cetirizine hcl) .... Take 1 tablet by mouth once a day 6)  Allergy Vaccine 1:10 Gh  .... Once a week 7)  Refresh 1 % Soln (Carboxymethylcellulose sodium) .... As needed 8)  Trusopt 2 % Soln (Dorzolamide hcl) .Marland Kitchen.. 1 drop each eye two times a day 9)  Betoptic-s 0.25 % Susp (Betaxolol hcl) .Marland Kitchen.. 1 drop each eye two times a day 10)  Lumigan 0.03 % Soln (Bimatoprost) .Marland Kitchen.. 1 drop in the right eye once daily  Complete Medication List: 1)  Vytorin 10-20 Mg Tabs (Ezetimibe-simvastatin) .... Take 1 tab by mouth at bedtime 2)  Amlodipine Besylate 5 Mg Tabs (Amlodipine besylate) .... Take 1 tablet by mouth once a day 3)  Toprol Xl 100 Mg Tb24 (Metoprolol succinate) .... Take 1 tablet by mouth once a day 4)  Hydrochlorothiazide 25 Mg Tabs (Hydrochlorothiazide) .... Take 1 tablet by mouth once a day 5)  Nitrostat 0.4 Mg Subl (Nitroglycerin) .Marland Kitchen.. 1 under  tongue very 5 minutes x3  as needed chest pain 6)  Adult Aspirin Ec Low Strength 81 Mg Tbec (Aspirin) .... Take 1 tablet by mouth once a day 7)  Multivitamins Caps (Multiple vitamin) .... Take 1 tablet by mouth once a day 8)  Zyrtec Allergy 10 Mg Tabs (Cetirizine hcl) .... Take 1 tablet by mouth once a day 9)  Allergy Vaccine 1:10 Gh  .... Once a week 10)  Proair Hfa 108 (90 Base) Mcg/act Aers (Albuterol sulfate) .... 2 puffs four times a day as needed 11)  Refresh 1 % Soln (Carboxymethylcellulose sodium) .... As needed 12)  Trusopt 2 % Soln (Dorzolamide hcl) .Marland Kitchen.. 1 drop each eye two times a day 13)  Betoptic-s 0.25 % Susp (Betaxolol hcl) .Marland Kitchen.. 1 drop each eye two times a day 14)  Lumigan 0.03 % Soln (Bimatoprost) .Marland Kitchen.. 1 drop in the right eye once daily 15)  Vitamin D 500  .... Take 1 by mouth once daily 16)  Folic Acid 800 Mcg Tabs (Folic acid) .... Take 1 by mouth once daily 17)  Lutein 20 Mg Caps (Lutein) .... Take 1 by mouth once daily 18)  Potassium 99 Mg Tabs (Potassium) .... Take 1 by mouth once daily 19)  Vitamin C 1000 Mg Tabs (Ascorbic acid) .... Take 1 by mouth once daily 20)  Fish Oil 300 Mg Caps (Omega-3 fatty acids) .... Take 1 by mouth once daily 21)  Flax Seed Oil 1000 Mg Caps (Flaxseed (linseed)) .... Take 1 by mouth once daily 22)  Calcium 600 Mg Tabs (Calcium) .... Take 1 by mouth once daily 23)  Vitamin E 400 Unit Caps (Vitamin e) .... Take 1 by mouth once daily 24)  Bilberry Plus Caps (Specialty vitamins products) .... Take 1 by mouth once daily  Patient Instructions: 1)  follow up Eye Doctor if eyes not improving.  2)  May use QVAR 40 2 puffs two times a day until sample is gone.  3)  Xopenex Inhaler 2puffs every 4 hr as needed wheeizng or shortness of breath.  4)  Please contact office for sooner follow up if symptoms do not improve or worsen  5)  follow up Dr. Maple Hudson in 2 weeks and as needed

## 2011-01-20 NOTE — Progress Notes (Signed)
Summary: proair rx  Phone Note Call from Patient Call back at Home Phone [phone]   Caller: Patient Call For: young Reason for Call: Refill Medication, Talk to Nurse Summary of Call: Patient saw Dr. Maple Hudson on 10/14 and sent rx for proair to drugstore.  Patient came by office and said she the drug store did not get script for Proair.  Please call in rx or resend.  [facility] Initial call taken by: Lehman Prom,  October 08, 2010 10:17 AM  Follow-up for Phone Call        [facility].  Spoke with pt and notified that this was done. Follow-up by: Vernie Murders,  October 08, 2010 11:31 AM

## 2011-01-20 NOTE — Miscellaneous (Signed)
Summary: Injection Record/[facility]  Injection Record/[facility]   Imported By: Sherian Rein 04/21/2010 14:26:45  _____________________________________________________________________  External Attachment:    Type:   Image     Comment:   External Document

## 2011-01-21 DIAGNOSIS — J301 Allergic rhinitis due to pollen: Secondary | ICD-10-CM

## 2011-01-28 DIAGNOSIS — J301 Allergic rhinitis due to pollen: Secondary | ICD-10-CM

## 2011-02-03 ENCOUNTER — Ambulatory Visit (INDEPENDENT_AMBULATORY_CARE_PROVIDER_SITE_OTHER): Payer: Medicare Other | Admitting: Internal Medicine

## 2011-02-03 ENCOUNTER — Encounter: Payer: Self-pay | Admitting: Internal Medicine

## 2011-02-03 ENCOUNTER — Ambulatory Visit (INDEPENDENT_AMBULATORY_CARE_PROVIDER_SITE_OTHER): Payer: Medicare Other

## 2011-02-03 DIAGNOSIS — H1045 Other chronic allergic conjunctivitis: Secondary | ICD-10-CM

## 2011-02-03 DIAGNOSIS — J309 Allergic rhinitis, unspecified: Secondary | ICD-10-CM

## 2011-02-03 DIAGNOSIS — J45909 Unspecified asthma, uncomplicated: Secondary | ICD-10-CM

## 2011-02-03 DIAGNOSIS — J301 Allergic rhinitis due to pollen: Secondary | ICD-10-CM

## 2011-02-08 ENCOUNTER — Ambulatory Visit: Payer: Self-pay | Admitting: Internal Medicine

## 2011-02-09 NOTE — Assessment & Plan Note (Signed)
Summary: 4 mth//jrc   Primary Provider/Referring Provider:  Manus Gunning  CC:  4 month follow up visit-allergies.  History of Present Illness: September 16, 2010 --Presents for work in visit. She began new eye drops on 08-26-10 - now having red, itchy eyes with drainage and increased SOB that began 5days ago, has been having eye issues for 1 month. Seen by eye doctor. last week. Feels like breathing is not as good last few days with intermittent cough. Denies chest pain,  orthopnea, hemoptysis, fever, n/v/d, edema, headache.   October 01, 2010 ............... 2 week follow up visit-breathing bad and Increased allergy flare up. Saw NP with increased asthma and conjunctivitis in late September. Now "doing tolerable". Eye doctor appointmnet is still pending. Albuterol worked pretty well, but she is out. Notices occasional cough and wheeze. Maybe a mild cold. Nasal stuffy w/o sore throat. Morning cough productive clear mucus. No benefit from Qvar inhaler. Continues allergy vaccine at 1:10.  February 03, 2011-  Allergic rhinitis, asthma, allergic conjunctivitis Nurse-CC: 4 month follow up visit-allergies Doing ok through this winter.  Allergy vaccine here 1:10- ok. She believes the shots help.   Still notes chronic conjunctival discharge- using meds from her eye doctor- ofloxacin and lubricant.  Asthma- only occasional need for her rescue inhaler.    Asthma History    Asthma Control Assessment:    Age range: 12+ years    Symptoms: 0-2 days/week    Nighttime Awakenings: 0-2/month    Interferes w/ normal activity: no limitations    SABA use (not for EIB): 0-2 days/week    Asthma Control Assessment: Well Controlled   Preventive Screening-Counseling & Management  Alcohol-Tobacco     Smoking Status: never  Current Medications (verified): 1)  Vytorin 10-20 Mg  Tabs (Ezetimibe-Simvastatin) .... Take 1 Tab By Mouth At Bedtime 2)  Amlodipine Besylate 5 Mg  Tabs (Amlodipine Besylate) .... Take 1  Tablet By Mouth Once A Day 3)  Toprol Xl 100 Mg  Tb24 (Metoprolol Succinate) .... Take 1 Tablet By Mouth Once A Day 4)  Hydrochlorothiazide 25 Mg  Tabs (Hydrochlorothiazide) .... Take 1 Tablet By Mouth Once A Day 5)  Nitrostat 0.4 Mg Subl (Nitroglycerin) .Marland Kitchen.. 1 Under Tongue Very 5 Minutes X3 As Needed Chest Pain 6)  Adult Aspirin Ec Low Strength 81 Mg  Tbec (Aspirin) .... Take 1 Tablet By Mouth Once A Day 7)  Multivitamins   Caps (Multiple Vitamin) .... Take 1 Tablet By Mouth Once A Day 8)  Zyrtec Allergy 10 Mg  Tabs (Cetirizine Hcl) .... Take 1 Tablet By Mouth Once A Day 9)  Allergy Vaccine 1:10 Gh .... Once A Week 10)  Proair Hfa 108 (90 Base) Mcg/act  Aers (Albuterol Sulfate) .... 2 Puffs Four Times A Day As Needed 11)  Refresh 1 % Soln (Carboxymethylcellulose Sodium) .... As Needed 12)  Trusopt 2 % Soln (Dorzolamide Hcl) .Marland Kitchen.. 1 Drop Each Eye Two Times A Day 13)  Betoptic-S 0.25 % Susp (Betaxolol Hcl) .Marland Kitchen.. 1 Drop Each Eye Two Times A Day 14)  Lumigan 0.03 % Soln (Bimatoprost) .Marland Kitchen.. 1 Drop in The Right Eye Once Daily 15)  Vitamin D 500 .... Take 1 By Mouth Once Daily 16)  Folic Acid 800 Mcg Tabs (Folic Acid) .... Take 1 By Mouth Once Daily 17)  Lutein 20 Mg Caps (Lutein) .... Take 1 By Mouth Once Daily 18)  Potassium 99 Mg Tabs (Potassium) .... Take 1 By Mouth Once Daily 19)  Vitamin C 1000 Mg Tabs (Ascorbic  Acid) .... Take 1 By Mouth Once Daily 20)  Fish Oil 300 Mg Caps (Omega-3 Fatty Acids) .... Take 1 By Mouth Once Daily 21)  Flax Seed Oil 1000 Mg Caps (Flaxseed (Linseed)) .... Take 1 By Mouth Once Daily 22)  Calcium 600 Mg Tabs (Calcium) .... Take 1 By Mouth Once Daily 23)  Vitamin E 400 Unit Caps (Vitamin E) .... Take 1 By Mouth Once Daily 24)  Bilberry Plus  Caps (Specialty Vitamins Products) .... Take 1 By Mouth Once Daily 25)  Optive 0.5-0.9 % Soln (Carboxymethylcellul-Glycerin) .... Use As Directed 26)  Systane 0.4-0.3 % Soln (Polyethyl Glycol-Propyl Glycol) .... Use As  Directed 27)  Ocuflox 0.3 % Soln (Ofloxacin) .... Use As Directed  Allergies (verified): 1)  ! Penicillin  Review of Systems      See HPI       The patient complains of nasal congestion/difficulty breathing through nose.  The patient denies shortness of breath with activity, shortness of breath at rest, productive cough, non-productive cough, coughing up blood, chest pain, irregular heartbeats, acid heartburn, indigestion, loss of appetite, weight change, abdominal pain, difficulty swallowing, sore throat, tooth/dental problems, headaches, and sneezing.    Vital Signs:  Patient profile:   77 year old female Height:      64 inches Weight:      135.50 pounds BMI:     23.34 O2 Sat:      98 % on Room air Pulse rate:   57 / minute BP sitting:   148 / 68  (left arm) Cuff size:   regular  Vitals Entered By: Reynaldo Minium CMA (February 03, 2011 11:02 AM)  O2 Flow:  Room air CC: 4 month follow up visit-allergies   Physical Exam  Additional Exam:  General: A/Ox3; pleasant and cooperative, NAD, calm SKIN: no rash, lesions NODES: no lymphadenopathy HEENT: [place]/AT, EOM- WNL,, conjunctiva slightly injected bilaterally, no discharge is noted. PERRLA, TM-WNL, Nose- clear, Throat- clear and wnl, dentures, Mallampati III NECK: Supple w/ fair ROM, JVD- none, normal carotid impulses w/o bruits  CHEST: Clear bilaterally with no wheezing. HEART: RRR, no m/g/r heard ABDOMEN: Soft and nl;   ZOX:WRUE, nl pulses, no edema  NEURO: Grossly intact to observation      Impression & Recommendations:  Problem # 1:  ALLERGIC RHINITIS (ICD-477.9) She has done well and is satisfied that allergy vaccine helps her. We will watch as seasonal changes begin. Her updated medication list for this problem includes:    Zyrtec Allergy 10 Mg Tabs (Cetirizine hcl) .Marland Kitchen... Take 1 tablet by mouth once a day  Problem # 2:  ALLERGIC CONJUNCTIVITIS (ICD-372.14) I don't know how much of her bothersome conjunctival  discharge is allergic, but it is appropriate that an antibiotic is being tried by her opthalmologist.  Problem # 3:  ASTHMA (ICD-493.90) Mild intermittent asthma, wih rare need now for intervention.   Medications Added to Medication List This Visit: 1)  Optive 0.5-0.9 % Soln (Carboxymethylcellul-glycerin) .... Use as directed 2)  Systane 0.4-0.3 % Soln (Polyethyl glycol-propyl glycol) .... Use as directed 3)  Ocuflox 0.3 % Soln (Ofloxacin) .... Use as directed  Other Orders: Est. Patient Level III (45409)  Patient Instructions: 1)  Please schedule a follow-up appointment in 1 year. Please call sooner as needed. 2)  Continue allergy shots

## 2011-02-11 ENCOUNTER — Ambulatory Visit (INDEPENDENT_AMBULATORY_CARE_PROVIDER_SITE_OTHER): Payer: Medicare Other

## 2011-02-11 DIAGNOSIS — J301 Allergic rhinitis due to pollen: Secondary | ICD-10-CM

## 2011-02-18 ENCOUNTER — Ambulatory Visit (INDEPENDENT_AMBULATORY_CARE_PROVIDER_SITE_OTHER): Payer: Medicare Other

## 2011-02-18 ENCOUNTER — Encounter: Payer: Self-pay | Admitting: Internal Medicine

## 2011-02-18 DIAGNOSIS — J301 Allergic rhinitis due to pollen: Secondary | ICD-10-CM

## 2011-02-18 DIAGNOSIS — J302 Other seasonal allergic rhinitis: Secondary | ICD-10-CM | POA: Insufficient documentation

## 2011-02-18 DIAGNOSIS — J3089 Other allergic rhinitis: Secondary | ICD-10-CM

## 2011-02-24 NOTE — Assessment & Plan Note (Signed)
Summary: allergy/cb  Nurse Visit   Allergies: 1)  ! Penicillin  Orders Added: 1)  Allergy Injection (1) [02725]

## 2011-02-25 ENCOUNTER — Encounter: Payer: Self-pay | Admitting: Internal Medicine

## 2011-02-25 ENCOUNTER — Ambulatory Visit (INDEPENDENT_AMBULATORY_CARE_PROVIDER_SITE_OTHER): Payer: Medicare Other

## 2011-02-25 DIAGNOSIS — J301 Allergic rhinitis due to pollen: Secondary | ICD-10-CM

## 2011-03-01 NOTE — Assessment & Plan Note (Signed)
Summary: ALLERGY/CB  Nurse Visit   Allergies: 1)  ! Penicillin  Orders Added: 1)  Allergy Injection (1) [45409]

## 2011-03-04 ENCOUNTER — Ambulatory Visit (INDEPENDENT_AMBULATORY_CARE_PROVIDER_SITE_OTHER): Payer: Medicare Other

## 2011-03-04 ENCOUNTER — Encounter: Payer: Self-pay | Admitting: Internal Medicine

## 2011-03-04 DIAGNOSIS — J301 Allergic rhinitis due to pollen: Secondary | ICD-10-CM

## 2011-03-08 NOTE — Assessment & Plan Note (Signed)
Summary: allergy/cb  Nurse Visit   Allergies: 1)  ! Penicillin  Orders Added: 1)  Allergy Injection (1) [95115] 

## 2011-03-11 ENCOUNTER — Ambulatory Visit (INDEPENDENT_AMBULATORY_CARE_PROVIDER_SITE_OTHER): Payer: Medicare Other

## 2011-03-11 DIAGNOSIS — J301 Allergic rhinitis due to pollen: Secondary | ICD-10-CM

## 2011-03-18 ENCOUNTER — Ambulatory Visit (INDEPENDENT_AMBULATORY_CARE_PROVIDER_SITE_OTHER): Payer: Medicare Other

## 2011-03-18 DIAGNOSIS — J301 Allergic rhinitis due to pollen: Secondary | ICD-10-CM

## 2011-03-21 ENCOUNTER — Ambulatory Visit (INDEPENDENT_AMBULATORY_CARE_PROVIDER_SITE_OTHER): Payer: Medicare Other

## 2011-03-21 DIAGNOSIS — J301 Allergic rhinitis due to pollen: Secondary | ICD-10-CM

## 2011-03-22 ENCOUNTER — Ambulatory Visit: Payer: Medicare Other

## 2011-04-01 ENCOUNTER — Ambulatory Visit (INDEPENDENT_AMBULATORY_CARE_PROVIDER_SITE_OTHER): Payer: Medicare Other

## 2011-04-01 DIAGNOSIS — J309 Allergic rhinitis, unspecified: Secondary | ICD-10-CM

## 2011-04-08 ENCOUNTER — Ambulatory Visit (INDEPENDENT_AMBULATORY_CARE_PROVIDER_SITE_OTHER): Payer: Medicare Other

## 2011-04-08 DIAGNOSIS — J309 Allergic rhinitis, unspecified: Secondary | ICD-10-CM

## 2011-04-15 ENCOUNTER — Ambulatory Visit (INDEPENDENT_AMBULATORY_CARE_PROVIDER_SITE_OTHER): Payer: Medicare Other

## 2011-04-15 DIAGNOSIS — J309 Allergic rhinitis, unspecified: Secondary | ICD-10-CM

## 2011-04-22 ENCOUNTER — Ambulatory Visit (INDEPENDENT_AMBULATORY_CARE_PROVIDER_SITE_OTHER): Payer: Medicare Other

## 2011-04-22 DIAGNOSIS — J309 Allergic rhinitis, unspecified: Secondary | ICD-10-CM

## 2011-04-29 ENCOUNTER — Ambulatory Visit (INDEPENDENT_AMBULATORY_CARE_PROVIDER_SITE_OTHER): Payer: Medicare Other

## 2011-04-29 DIAGNOSIS — J309 Allergic rhinitis, unspecified: Secondary | ICD-10-CM

## 2011-05-03 NOTE — Assessment & Plan Note (Signed)
[facility]                             PULMONARY OFFICE NOTE   BIRTIE, FELLMAN                  MRN:          [id number]  DATE:09/11/2007                            DOB:          11/13/1922    PROBLEMS:  1. Allergic conjunctivitis.  2. Allergic rhinitis.  3. Allergic asthma (failed Xolair).   HISTORY:  She is holding well on maintenance allergy vaccine, getting  injections here with no problems at all.  Her asthma and allergic  conjunctivitis control has been quite good through this fall so far.  Optivar did help her lacrimation complaint   MEDICATIONS:  Medication list is reviewed as charted without changes.  She is comfortable with the present status.   PHYSICAL EXAMINATION:  VITAL SIGNS:  Weight 165 pounds, blood pressure  128/66, pulse 59, room air saturation 97%.  Nasal mucosa looks normal.  Conjunctivae are not injected.  There is a trace systolic murmur over  the aortic area.  LUNGS:  Clear.   IMPRESSION:  Allergic asthma, rhinitis and conjunctivitis with good  control currently.   PLAN:  Another sample of Optivar.  Advance vaccine to 1:10 with next  order.  Schedule return in six months, earlier p.r.n.     Clinton D. Maple Hudson, MD, Tonny Bollman, FACP  Electronically Signed    CDY/MedQ  DD: 09/12/2007  DT: 09/13/2007  Job #: [id number]   cc:   Bryan Lemma. Manus Gunning, M.D.

## 2011-05-06 ENCOUNTER — Ambulatory Visit (INDEPENDENT_AMBULATORY_CARE_PROVIDER_SITE_OTHER): Payer: Medicare Other

## 2011-05-06 DIAGNOSIS — J309 Allergic rhinitis, unspecified: Secondary | ICD-10-CM

## 2011-05-06 NOTE — Assessment & Plan Note (Signed)
[facility]                             PULMONARY OFFICE NOTE   Jeanette Montoya, Jeanette Montoya                  MRN:          [id number]  DATE:01/16/2007                            DOB:          05/01/1922    PROBLEM:  Pulmonary/allergy followup for allergic rhinitis and asthmatic  bronchitis.   HISTORY:  She had been on Xolair for 14 months at 300 mg per month but  does not recognize benefit. She may have had less cough and wheeze. Dr.  Delford Field asked her to see me again for consideration of a trial of allergy  vaccine. Her primary complaint has been of itching and watering of eyes  with overflow lacrimation especially of the left eye. She has not had  much discomfort this winter from wheeze or nasal congestion. Allergy  skin testing had been positive in November 2006 with significant  reactions for grass, weed and tree pollens, house dust and dust mite.   MEDICATIONS:  1. Vytorin 10/20.  2. HCTZ 25 mg.  3. Norvasc 5 mg.  4. Toprol 100 mg.  5. Aspirin 81 mg.  6. Zyrtec 10 mg.  7. Albuterol rescue inhaler.  8. Travatan 0.04% eye drops.  9. Refresh tears.  10.Advair 100/50.  11.Nasacort AQ.  12.Nitroglycerin 0.4 mg.  13.Vitamin and mineral supplements.  14.Alphagan and Trusopt eye drops.  15.Patanol.  16.Asmanex.   OBJECTIVE:  Weight 177 pounds, blood pressure 122/76, pulse regular 64,  room air saturation 98%. There is mild bilateral conjunctival injection.  She wiped at her eyes with a Kleenex but I saw no periorbital eczema or  irritation. There was no cobblestoning of the conjunctiva. Scant  conjunctival fluid looked clear. Her nasal airway was wet but  unobstructed. Pharynx clear. Chest clear except for trace crackle in the  mid zone bilaterally. Heart sounds regular without murmur or gallop.   IMPRESSION:  1. Allergic conjunctivitis.  2. History of allergic rhinitis and allergic asthma, unsuccessful      control with a trial of  Xolair.   PLAN:  We discussed allergy vaccine as a treatment strategy with a frank  discussion of risk, realistic benefit and duration goals. She wishes to  try allergy vaccine. We will begin and build her injections here. Xolair  is discontinued. Schedule return to me in 2 months, earlier p.r.n.     Clinton D. Maple Hudson, MD, Tonny Bollman, FACP  Electronically Signed    CDY/MedQ  DD: 01/16/2007  DT: 01/17/2007  Job #: [id number]   cc:   Bryan Lemma. Manus Gunning, M.D.  Robert L. Dione Booze, M.D.

## 2011-05-06 NOTE — Cardiovascular Report (Signed)
[place]. [facility]  Patient:    Jeanette Montoya, Jeanette Montoya Visit Number: [id number] MRN: [id number]          Service Type: Attending:  Meade Maw, M.D. Adm. Date:  08/28/01   CC:         Dyanne Carrel, M.D.   Cardiac Catheterization  INDICATIONS:  Chest pain with reproducable exertion, Cardiolite findings suggestive of ischemia in the anterior anterior apical region.  PROCEDURE:  The patient was initially admitted secondary to creatinine being elevated at 1.8.  Her Atican and Lasix was held.  IV hydration was started. The patient was given Mucomist p.o. prior to catheterization.  Creatinine prior to catheterization had corrected to 1.2.  After obtaining written informed consent, the patient was brought to the cardiac catheterization lab in the postabsorptive state.  Preoperative sedation was achieved with Valium p.o. and Versed 1 mg IV.  The right groin was prepped and draped in the usual sterile fashion.  Local anesthesia was achieved using 1% Xylocaine.  A 6 French hemostasis sheath was placed into the right femoral artery using modified Seldinger technique.  Selective coronary angiography was performed using a 6 [place] JL4, JR4 Judkins catheter, nonionic contrast was used and was hand injected.  All catheter exchanges were made over a guide wire.  Following the procedure, there was no critical disease amenable to angioplasty.  The patient was transferred to the holding area.  Hemostasis sheath was removed.  FINDINGS:  The aortic pressure was 133/87, LV pressure 133/12, single plane ventriculogram revealed normal wall motion with an ejection fraction of 65%. There was no significant mitral regurgitation noted.  The left main coronary artery trifurcated into the left anterior descending, ramus vessel, and circumflex vessel.  There was no significant disease in the left main coronary artery.  The left anterior descending.  The left anterior  descending had mild calcification in th eproximal LAD.  The left anterior descending gave rise to a small D1, D2, D3, and went on to end as an apical recurrent.  The first small diagonal which was less than 0.5 mm in size had a 60 to 70% ostial lesion noted.  The ramus vessel was a moderate size vessel and had no significant disease.  Circumflex.  The circumflex vessel gave rise to a large OM1, small OM2, small OM3, and had no significant disease.  The right coronary artery was dominant and gave rise to a small to moderate PDA and large trifurcating posterolateral branch.  There was diffuse disease in the right coronary artery of up to 50%.  IMPRESSION: 1. 60 to 70% ostial diagonal 1, noncritical disease in the mid right    coronary artery, preserved ejection fraction at approximately 65%.    Continue nitrates.  Her LDL goal is less than 100. 2. Renal insufficiency.  Would avoid further ACE and arbs.  Would continue    to treat blood pressure with Toprol.  I will see her back in my office in two weeks.  She will be scheduled to have a BMET performed on Friday, which is two days from today. Attending:  Meade Maw, M.D. DD:  08/29/01 TD:  08/29/01 Job: [id number]

## 2011-05-06 NOTE — Assessment & Plan Note (Signed)
[facility]                             PULMONARY OFFICE NOTE   LYNDON, CHENOWETH                  MRN:          [id number]  DATE:12/26/2006                            DOB:          August 05, 1922    Ms. Philipps is an 77 year old African American female significant atopic  asthma, positive high IGE levels.  She has no Xolair therapy since  October 2006.  In this interval of time, she has not seen substantial  improvement in her asthma control while we have been able to reduce her  Advair from 250/50 down to Asthmanex 2 sprays daily.  Her level of  dyspnea is slightly worse.  She is off the Accolate but continues to  have nasal itching and watery eyes and scratchy eyes.  She is using the  Patanol eye drops.  She states the Veramyst was not much better than  Nasacort for eye symptoms.   PHYSICAL EXAMINATION:  VITAL SIGNS:  Temp 99, blood pressure 140/74,  pulse 75, saturation 97% room air.  CHEST:  Showed to be clear without evidence of wheeze or rhonchi.  CARDIAC:  Showed a regular rate and rhythm without S3.  Normal S1 S2.  ABDOMEN:  Soft, nontender.  EXTREMITIES:  Showed no edema or clubbing.  SKIN:  Clear.   IMPRESSION:  Mild to moderate asthma with significant atopic features.   PLAN:  1. Maintain Zyrtec 10 mg daily.  2. Resume Advair 100/50, one spray b.i.d.  3. Discontinue the Xolair.  4. Discontinue Asthmanex.  5. Maintain albuterol p.r.n.  6. Maintain Zyrtec daily and Nasacort daily.  7. We will refer her back to Dr. Jetty Duhamel for consideration for      allergy immunotherapy.     Charlcie Cradle Delford Field, MD, [facility]  Electronically Signed    PEW/MedQ  DD: 12/26/2006  DT: 12/26/2006  Job #: [id number]   cc:   Bryan Lemma. Manus Gunning, M.D.  Clinton D. Maple Hudson, MD, FCCP, FACP

## 2011-05-06 NOTE — Assessment & Plan Note (Signed)
[facility]                             PULMONARY OFFICE NOTE   Jeanette, Montoya                  MRN:          [id number]  DATE:03/13/2007                            DOB:          1922-06-09    PROBLEMS:  1. Allergic conjunctivitis.  2. Allergic rhinitis and allergic asthma (failed Xolair).   HISTORY:  We started allergy vaccine in January and she is building up  here with no problems.  Rare need for albuterol, none in the last 2  weeks.  No significant seasonal flare of symptoms so far this Spring.  We discussed management options.  She does have Nasacort AQ, Advair  100/50, and Zyrtec.   OBJECTIVE:  Weight 188 pounds, blood pressure 130/70, pulse 63, room air  saturation 99%.  Conjunctivae are minimally injected and this may be  baseline for her.  Lacrimal fluid is clear.  Nasal airway is not  obstructed, there is no significant mucus.  CHEST:  Sounds clear today.  HEART:  Sounds are regular without murmur.   IMPRESSION:  Allergic asthma, allergic rhinitis, allergic  conjunctivitis, currently stable.  She will continue vaccine buildup and  use of medications already available.  Schedule return 6 months but  earlier p.r.n.     Clinton D. Maple Hudson, MD, [facility], FACP  Electronically Signed    CDY/MedQ  DD: 03/17/2007  DT: 03/17/2007  Job #: [id number]

## 2011-05-06 NOTE — Assessment & Plan Note (Signed)
Jeanette Montoya                               PULMONARY OFFICE NOTE   Jeanette Montoya, Jeanette Montoya                  MRN:          [id number]  DATE:10/17/2006                            DOB:          03-09-1922    HISTORY OF PRESENT ILLNESS:  Jeanette Montoya is an 77 year old African-American  female with history of asthmatic bronchitis, significant atopy.  She has  increasing itchiness in the eyes.  Her shortness of breath, though, is  stable, maintaining Zyrtec 10 mg daily, Nasacort two sprays each nostril  daily, Xolair 300 mg monthly, albuterol p.r.n., Asmanex two sprays daily.  She also has significant glaucoma and is on the Trusopt eye drops t.i.d.,  Alphagan t.i.d., and Patanol t.i.d. drops.  She is off the Cosopt drops.   PHYSICAL EXAMINATION:  VITAL SIGNS:  Temperature 98, blood pressure 130/74,  pulse 86, saturation 98% on room air.  CHEST:  Showed to be clear without evidence of wheeze or rhonchi.  CARDIAC:  Showed a regular rate and rhythm without S3.  Normal S1/S2.  ABDOMEN:  Soft, nontender.  EXTREMITIES:  Showed no edema or clubbing.  SKIN:  Clear.  NEUROLOGIC:  Exam was intact.  HEENT:  Revealed significant nasal inflammation.  The conjunctivae showed  some degree of redness and inflammation but no purulence.   IMPRESSION:  1. Severe allergic conjunctivitis, stable.  2. Severe persistent asthma.  3. Allergic rhinitis   PLAN:  Switch Nasacort to __________ two sprays each nostril daily.  Maintain Patanol eye drops.  Follow back up with her ophthalmologist for any  additional eye issues.  Maintain help medicines as currently dosed.  Maintain Xolair monthly and will see this patient back in return follow-up  in three months.  A flu vaccine was administered.     Charlcie Cradle Delford Field, MD, [facility]  Electronically Signed    PEW/MedQ  DD: 10/17/2006  DT: 10/17/2006  Job #: [id number]   cc:   Bryan Lemma. Manus Gunning, M.D.  Robert L. Dione Booze, M.D.

## 2011-05-06 NOTE — H&P (Signed)
[place]. [facility]  Patient:    Jeanette Montoya, Jeanette Montoya Visit Number: [id number] MRN: [id number]          Service Type: CAT Location: [facility] [id number] Attending Physician:  Mora Appl Dictated by:   Meade Maw, M.D. Admit Date:  08/28/2001   CC:         Dyanne Carrel, M.D.   History and Physical  REFERRING PHYSICIAN:  Dyanne Carrel, M.D.  REASON FOR ADMISSION:  Renal insufficiency, admission prior to left heart catheterization.  HISTORY:  Jeanette Montoya is a pleasant 77 year old female who has noted the development of chest pressure and fullness associated with shortness of breath for approximately six weeks.  The chest pressure will occur during her morning exercise program, and will occur at approximately two miles and then resolve with rest.  The chest pain is predictable.  There is no nausea, vomiting, or diaphoresis.  She denies presyncope or syncope.  An adenosine Cardiolite was performed.  Her baseline ECG was noted to be normal.  There was no arrhythmia with infusion.  Her Cardiolite revealed decreased uptake in the anterior, anterior apical regions, starting at the mid base and approaching the apex.  There appeared to be significant redistribution with rest.  These findings were discussed with Lanora Manis.  She was brought to the outpatient catheterization laboratory with anticipation for outpatient catheterization. She was noted to have an elevated creatinine at 1.8.  CORONARY RISK FACTORS:  Significant for post menopausal status, hypertension, dislipidemia.  There is no significant family history, no history of tobacco use or diabetes.  PAST MEDICAL HISTORY: 1. Hypertension. 2. Glaucoma.  PAST SURGICAL HISTORY:  Breast biopsy for fibrocystic disease.  CURRENT MEDICATIONS: 1. Atacand 16 mg p.o. q.d. 2. Plendil 5 mg p.o. q.d. 3. Lasix 20 mg p.o. q.d. 4. Eyedrops including Timoptic and Xalatan. 5. Lescol 80 mg  q.d.  ALLERGIES:  PENICILLIN - which results in swelling.  SOCIAL HISTORY:  She has been married for 57 years, lives with her husband, and enjoys gardening.  She is a Engineer, maintenance (IT), is retired from [employer].  She is a nonsmoker.  There is no history of alcohol or other drugs of abuse.  REVIEW OF SYSTEMS:  Significant for asthma, sinus problems, and glaucoma.  She is unable to tolerate ZESTRIL secondary to cough.  She has had borderline renal insufficiency with a creatinine ranging from 1.3 to 1.6.  Her LDL is 118 - this is down from 174.  PHYSICAL EXAMINATION:  GENERAL:  Reveals an elderly female in no acute distress.  VITAL SIGNS:  Blood pressure today is 182/75, heart rate 72.  She is afebrile.  HEENT:  Unremarkable.  NECK:  Good carotid upstrokes.  No carotid bruit.  PULMONARY:  Reveals breath sounds which are equal and clear to auscultation. No use of accessory muscles.  CARDIOVASCULAR:  Reveals a regular rate and rhythm, normal S1, normal S1.  No rubs, murmurs, gallops noted.  ABDOMEN:  Soft, benign, nontender.  EXTREMITIES:  Revealed no peripheral edema.  Distal pulses are equal but weak.  NEUROLOGIC:  Nonfocal.  Motor is 5/5 throughout.  LABORATORY DATA:  Adenosine Cardiolite as noted above reveals decreased uptake in the anterior walls in the mid base approaching the apex.  There is redistribution on rest images.  Her ejection fraction is 68% with an end diastolic volume of 53 cc.  Her creatinine this morning is 1.8 with a BUN of 28, potassium 4.5. Hematocrit 34.6, platelet count 232.  She has a normal INR.  IMPRESSION: 1. Evidence of reversible ischemia by Cardiolite in an elderly female with    renal insufficiency.  She will be admitted.  Atacand and Lasix will be    held.  She will be started on Toprol as an antihypertensive.  IV    hydration with one-half normal saline and Mucomyst 600 mg p.o. six hours,    one hour before catheterization and  six hours x 2 following her    catheterization.  She will have a repeat BMET in the a.m. 2. Dislipidemia.  She will be continued on her Lescol.  Fasting lipid profile    will be obtained in the a.m. 3. Renal insufficiency.  Repeat BMET prior to catheterization. Dictated by:   Meade Maw, M.D. Attending Physician:  Meade Maw A DD:  08/28/01 TD:  08/28/01 Job: [id number]

## 2011-05-06 NOTE — Assessment & Plan Note (Signed)
[facility]                               PULMONARY OFFICE NOTE   Jeanette Montoya, Jeanette Montoya                  MRN:          [id number]  DATE:08/15/2006                            DOB:          July 07, 1922    Jeanette Montoya is an 77 year old white African-American female, history of  significant atopic features, asthmatic bronchitis.  Overall her cough is  better.  She has improved breath control.  She is maintained on Xolair 300  mg monthly, Nasacort 2 sprays each nostril daily, Advair 100/50 1 spray  b.i.d., Zyrtec 10 mg daily.   PHYSICAL EXAMINATION:  VITAL SIGNS:  Temp 98, blood pressure 136/64, pulse  63, saturation 96% on room air.  CHEST:  Diminished breath sounds with no evidence of wheeze or rhonchi.  CARDIAC:  A regular, rate and rhythm without S3.  Normal S1, S2.  ABDOMEN:  Soft, nontender.  EXTREMITIES:  No edema or clubbing.  SKIN:  Clear.   IMPRESSION:  Improved air flow function with stable airway disease.  Significant atopic features.   PLAN:  Maintain Xolair as is and switch over to Asmanex 2 sprays daily and  discontinue further Advair.  We will see the patient back in 2 weeks.                                   Charlcie Cradle Delford Field, MD, FCCP   PEW/MedQ  DD:  08/15/2006  DT:  08/16/2006  Job #:  [id number]

## 2011-05-13 ENCOUNTER — Ambulatory Visit (INDEPENDENT_AMBULATORY_CARE_PROVIDER_SITE_OTHER): Payer: Medicare Other

## 2011-05-13 DIAGNOSIS — J309 Allergic rhinitis, unspecified: Secondary | ICD-10-CM

## 2011-05-20 ENCOUNTER — Ambulatory Visit (INDEPENDENT_AMBULATORY_CARE_PROVIDER_SITE_OTHER): Payer: Medicare Other

## 2011-05-20 DIAGNOSIS — J309 Allergic rhinitis, unspecified: Secondary | ICD-10-CM

## 2011-05-27 ENCOUNTER — Ambulatory Visit (INDEPENDENT_AMBULATORY_CARE_PROVIDER_SITE_OTHER): Payer: Medicare Other

## 2011-05-27 DIAGNOSIS — J309 Allergic rhinitis, unspecified: Secondary | ICD-10-CM

## 2011-06-03 ENCOUNTER — Ambulatory Visit (INDEPENDENT_AMBULATORY_CARE_PROVIDER_SITE_OTHER): Payer: Medicare Other

## 2011-06-03 DIAGNOSIS — J309 Allergic rhinitis, unspecified: Secondary | ICD-10-CM

## 2011-06-10 ENCOUNTER — Ambulatory Visit (INDEPENDENT_AMBULATORY_CARE_PROVIDER_SITE_OTHER): Payer: Medicare Other

## 2011-06-10 DIAGNOSIS — J309 Allergic rhinitis, unspecified: Secondary | ICD-10-CM

## 2011-06-13 ENCOUNTER — Encounter: Payer: Self-pay | Admitting: Internal Medicine

## 2011-06-17 ENCOUNTER — Ambulatory Visit (INDEPENDENT_AMBULATORY_CARE_PROVIDER_SITE_OTHER): Payer: Medicare Other

## 2011-06-17 DIAGNOSIS — J309 Allergic rhinitis, unspecified: Secondary | ICD-10-CM

## 2011-06-24 ENCOUNTER — Ambulatory Visit (INDEPENDENT_AMBULATORY_CARE_PROVIDER_SITE_OTHER): Payer: Medicare Other

## 2011-06-24 DIAGNOSIS — J309 Allergic rhinitis, unspecified: Secondary | ICD-10-CM

## 2011-06-30 ENCOUNTER — Ambulatory Visit (INDEPENDENT_AMBULATORY_CARE_PROVIDER_SITE_OTHER): Payer: Medicare Other

## 2011-06-30 DIAGNOSIS — J309 Allergic rhinitis, unspecified: Secondary | ICD-10-CM

## 2011-07-08 ENCOUNTER — Ambulatory Visit (INDEPENDENT_AMBULATORY_CARE_PROVIDER_SITE_OTHER): Payer: Medicare Other

## 2011-07-08 DIAGNOSIS — J309 Allergic rhinitis, unspecified: Secondary | ICD-10-CM

## 2011-07-11 ENCOUNTER — Telehealth: Payer: Self-pay | Admitting: Internal Medicine

## 2011-07-11 MED ORDER — ALBUTEROL SULFATE HFA 108 (90 BASE) MCG/ACT IN AERS: 2.0000 | INHALATION_SPRAY | Freq: Four times a day (QID) | RESPIRATORY_TRACT | Status: DC | PRN

## 2011-07-11 NOTE — Telephone Encounter (Signed)
Per CDY: refill proair and still needs to be seen.  Try for later in the week.  Only available opening in CDY's schedule is tomorrow 7.24.12 @ 1030.  Pt okay with this date and time.  proair refill sent to [facility].  Pt aware of breathing worsens before appt to go to [facility] / ER.

## 2011-07-11 NOTE — Telephone Encounter (Signed)
Called and spoke with pt. Pt c/o increased sob. States she has a difficult time "catching her breath."  Also c/o wheezing and tightness in chest.  Symptoms started last week.  Denies cough, f/c/s.  Asked if pt had a proair (as this med was listed on her current med list in [employer]) but pt states she does not have a proair to use.  Pt is requesting CY's recs.  Please advise.  Thanks.  Allergies: PCN

## 2011-07-12 ENCOUNTER — Ambulatory Visit (INDEPENDENT_AMBULATORY_CARE_PROVIDER_SITE_OTHER): Payer: Medicare Other | Admitting: Internal Medicine

## 2011-07-12 ENCOUNTER — Ambulatory Visit (INDEPENDENT_AMBULATORY_CARE_PROVIDER_SITE_OTHER)
Admission: RE | Admit: 2011-07-12 | Discharge: 2011-07-12 | Disposition: A | Discharge status: Home / Self Care | Payer: Medicare Other | Source: Ambulatory Visit | Attending: Internal Medicine | Admitting: Internal Medicine

## 2011-07-12 ENCOUNTER — Encounter: Payer: Self-pay | Admitting: Internal Medicine

## 2011-07-12 VITALS — BP 120/72 | HR 51 | Ht 64.0 in | Wt 126.6 lb

## 2011-07-12 DIAGNOSIS — H1045 Other chronic allergic conjunctivitis: Secondary | ICD-10-CM

## 2011-07-12 DIAGNOSIS — J45909 Unspecified asthma, uncomplicated: Secondary | ICD-10-CM

## 2011-07-12 DIAGNOSIS — J301 Allergic rhinitis due to pollen: Secondary | ICD-10-CM

## 2011-07-12 NOTE — Progress Notes (Signed)
Subjective:    Patient ID: Jeanette Montoya, female    DOB: [birth year], 77 y.o.   MRN: [id number]  HPI 07/12/11- 27 yoF never smoker followed for allergic rhinitis, allergic conjunctivitis, asthma, complicated by HBP. Last here February 03, 2011- note reviewed In the past week she has been having episodes of shortness of breath, hard to get satisfying breath, feeling somehow associated with right side of neck.. Maybe some cough, very scant mucus. Denies pain, palpitation, syncope. Proair didn't help. Today feels well. Can't identify an associated trigger, relief or situation. Spells last a few minutes, self limited. Denies swelling or aching in legs.  Still bothered by chronic mattering from eyes. The allergy shots help and she is choosing to continue. 1:10 GH Review of Systems Constitutional:   No-   weight loss, night sweats, fevers, chills, fatigue, lassitude. HEENT:   No-   headaches, difficulty swallowing, tooth/dental problems, sore throat,              CV:  No-   chest pain, orthopnea, PND, swelling in lower extremities, anasarca, dizziness, palpitations  GI:  No-   heartburn, indigestion, abdominal pain, nausea, vomiting, diarrhea,                 change in bowel habits, loss of appetite  Resp:   No-  excess mucus,             No-  coughing up of blood.              No-   change in color of mucus.  No- wheezing.    Skin: No-   rash or lesions.  GU: No-   dysuria, change in color of urine, no urgency or frequency.  No- flank pain.  MS:  No-   joint pain or swelling.  No- decreased range of motion.  No- back pain.  Psych:  No- change in mood or affect. No depression or anxiety.  No memory loss.      Objective:   Physical Exam General- Alert, Oriented, Affect-appropriate, Distress- none acute    Small, elderly lady Skin- rash-none, lesions- none, excoriation- none Lymphadenopathy- none Head- atraumatic            Eyes- Gross vision intact, PERRLA, conjunctivae clear  secretions. She is wiping at eyes, but with no discharge or injection seen.            Ears- Hearing, canals            Nose- Clear, No-Septal dev, mucus, polyps, erosion, perforation             Throat- Mallampati II , mucosa clear , drainage- none, tonsils- atrophic Neck- flexible , trachea midline, no stridor , thyroid nl, carotid no bruit Chest - symmetrical excursion , unlabored           Heart/CV- RRR , no murmur , no gallop  , no rub, nl s1 s2                           - JVD- none , edema- none, stasis changes- none, varices- none           Lung- clear to P&A, wheeze- none, cough- none , dullness-none, rub- none           Chest wall-  Abd- tender-no, distended-no, bowel sounds-present, HSM- no Br/ Gen/ Rectal- Not done, not indicated Extrem- cyanosis- none, clubbing, none, atrophy- none, strength- nl Neuro-  grossly intact to observation         Assessment & Plan:

## 2011-07-12 NOTE — Assessment & Plan Note (Signed)
She still feels allergy vaccine helps her nose and eyes.

## 2011-07-12 NOTE — Patient Instructions (Addendum)
Order- CXR  Dx asthma  Try using your Proair rescue inhaler when you feel the shortness of breath. Notice what brings the spells on and what relieves them.

## 2011-07-12 NOTE — Assessment & Plan Note (Addendum)
She may be describing mild asthma at times, although she couldn't tell that rescue inhaler helped. Heart sounds normal and I hear no bruits. We will get CXR and ask her to note more details.

## 2011-07-15 ENCOUNTER — Ambulatory Visit (INDEPENDENT_AMBULATORY_CARE_PROVIDER_SITE_OTHER): Payer: Medicare Other

## 2011-07-15 DIAGNOSIS — J309 Allergic rhinitis, unspecified: Secondary | ICD-10-CM

## 2011-07-15 NOTE — Assessment & Plan Note (Signed)
She usually complains, but I rarely see much sign of inflammation. Have directed toward artificial tears.

## 2011-07-22 ENCOUNTER — Ambulatory Visit (INDEPENDENT_AMBULATORY_CARE_PROVIDER_SITE_OTHER): Payer: Medicare Other

## 2011-07-22 DIAGNOSIS — J309 Allergic rhinitis, unspecified: Secondary | ICD-10-CM

## 2011-07-29 ENCOUNTER — Ambulatory Visit (INDEPENDENT_AMBULATORY_CARE_PROVIDER_SITE_OTHER): Payer: Medicare Other

## 2011-07-29 DIAGNOSIS — J309 Allergic rhinitis, unspecified: Secondary | ICD-10-CM

## 2011-08-01 NOTE — Progress Notes (Signed)
Quick Note:  ATC unable to leave message will try again later. ______

## 2011-08-02 ENCOUNTER — Ambulatory Visit (INDEPENDENT_AMBULATORY_CARE_PROVIDER_SITE_OTHER): Payer: Medicare Other

## 2011-08-02 DIAGNOSIS — J309 Allergic rhinitis, unspecified: Secondary | ICD-10-CM

## 2011-08-05 ENCOUNTER — Ambulatory Visit (INDEPENDENT_AMBULATORY_CARE_PROVIDER_SITE_OTHER): Payer: Medicare Other

## 2011-08-05 DIAGNOSIS — J309 Allergic rhinitis, unspecified: Secondary | ICD-10-CM

## 2011-08-12 ENCOUNTER — Ambulatory Visit (INDEPENDENT_AMBULATORY_CARE_PROVIDER_SITE_OTHER): Payer: Medicare Other

## 2011-08-12 DIAGNOSIS — J309 Allergic rhinitis, unspecified: Secondary | ICD-10-CM

## 2011-08-19 ENCOUNTER — Ambulatory Visit (INDEPENDENT_AMBULATORY_CARE_PROVIDER_SITE_OTHER): Payer: Medicare Other

## 2011-08-19 DIAGNOSIS — J309 Allergic rhinitis, unspecified: Secondary | ICD-10-CM

## 2011-08-26 ENCOUNTER — Ambulatory Visit (INDEPENDENT_AMBULATORY_CARE_PROVIDER_SITE_OTHER): Payer: Medicare Other

## 2011-08-26 DIAGNOSIS — J309 Allergic rhinitis, unspecified: Secondary | ICD-10-CM

## 2011-09-02 ENCOUNTER — Ambulatory Visit (INDEPENDENT_AMBULATORY_CARE_PROVIDER_SITE_OTHER): Payer: Medicare Other

## 2011-09-02 DIAGNOSIS — Z23 Encounter for immunization: Secondary | ICD-10-CM

## 2011-09-02 DIAGNOSIS — J309 Allergic rhinitis, unspecified: Secondary | ICD-10-CM

## 2011-09-09 ENCOUNTER — Ambulatory Visit (INDEPENDENT_AMBULATORY_CARE_PROVIDER_SITE_OTHER): Payer: Medicare Other

## 2011-09-09 DIAGNOSIS — J309 Allergic rhinitis, unspecified: Secondary | ICD-10-CM

## 2011-09-16 ENCOUNTER — Ambulatory Visit (INDEPENDENT_AMBULATORY_CARE_PROVIDER_SITE_OTHER): Payer: Medicare Other

## 2011-09-16 DIAGNOSIS — J309 Allergic rhinitis, unspecified: Secondary | ICD-10-CM

## 2011-09-23 ENCOUNTER — Ambulatory Visit (INDEPENDENT_AMBULATORY_CARE_PROVIDER_SITE_OTHER): Payer: Medicare Other

## 2011-09-23 DIAGNOSIS — J309 Allergic rhinitis, unspecified: Secondary | ICD-10-CM

## 2011-09-26 ENCOUNTER — Encounter: Payer: Self-pay | Admitting: Internal Medicine

## 2011-09-30 ENCOUNTER — Ambulatory Visit (INDEPENDENT_AMBULATORY_CARE_PROVIDER_SITE_OTHER): Payer: Medicare Other

## 2011-09-30 DIAGNOSIS — J309 Allergic rhinitis, unspecified: Secondary | ICD-10-CM

## 2011-10-07 ENCOUNTER — Ambulatory Visit (INDEPENDENT_AMBULATORY_CARE_PROVIDER_SITE_OTHER): Payer: Medicare Other

## 2011-10-07 DIAGNOSIS — J309 Allergic rhinitis, unspecified: Secondary | ICD-10-CM

## 2011-10-14 ENCOUNTER — Ambulatory Visit (INDEPENDENT_AMBULATORY_CARE_PROVIDER_SITE_OTHER): Payer: Medicare Other

## 2011-10-14 DIAGNOSIS — J309 Allergic rhinitis, unspecified: Secondary | ICD-10-CM

## 2011-10-21 ENCOUNTER — Ambulatory Visit (INDEPENDENT_AMBULATORY_CARE_PROVIDER_SITE_OTHER): Payer: Medicare Other

## 2011-10-21 DIAGNOSIS — J309 Allergic rhinitis, unspecified: Secondary | ICD-10-CM

## 2011-10-28 ENCOUNTER — Ambulatory Visit (INDEPENDENT_AMBULATORY_CARE_PROVIDER_SITE_OTHER): Payer: Medicare Other

## 2011-10-28 DIAGNOSIS — J309 Allergic rhinitis, unspecified: Secondary | ICD-10-CM

## 2011-11-04 ENCOUNTER — Ambulatory Visit (INDEPENDENT_AMBULATORY_CARE_PROVIDER_SITE_OTHER): Payer: Medicare Other

## 2011-11-04 DIAGNOSIS — J309 Allergic rhinitis, unspecified: Secondary | ICD-10-CM

## 2011-11-11 ENCOUNTER — Ambulatory Visit (INDEPENDENT_AMBULATORY_CARE_PROVIDER_SITE_OTHER): Payer: Medicare Other

## 2011-11-11 DIAGNOSIS — J309 Allergic rhinitis, unspecified: Secondary | ICD-10-CM

## 2011-11-18 ENCOUNTER — Ambulatory Visit (INDEPENDENT_AMBULATORY_CARE_PROVIDER_SITE_OTHER): Payer: Medicare Other

## 2011-11-18 DIAGNOSIS — J309 Allergic rhinitis, unspecified: Secondary | ICD-10-CM

## 2011-11-25 ENCOUNTER — Ambulatory Visit (INDEPENDENT_AMBULATORY_CARE_PROVIDER_SITE_OTHER): Payer: Medicare Other

## 2011-11-25 DIAGNOSIS — J309 Allergic rhinitis, unspecified: Secondary | ICD-10-CM

## 2011-11-28 ENCOUNTER — Ambulatory Visit (INDEPENDENT_AMBULATORY_CARE_PROVIDER_SITE_OTHER): Payer: Medicare Other

## 2011-11-28 DIAGNOSIS — J309 Allergic rhinitis, unspecified: Secondary | ICD-10-CM

## 2011-12-02 ENCOUNTER — Ambulatory Visit (INDEPENDENT_AMBULATORY_CARE_PROVIDER_SITE_OTHER): Payer: Medicare Other

## 2011-12-02 DIAGNOSIS — J309 Allergic rhinitis, unspecified: Secondary | ICD-10-CM

## 2011-12-03 ENCOUNTER — Encounter (HOSPITAL_COMMUNITY): Payer: Self-pay

## 2011-12-03 ENCOUNTER — Emergency Department (INDEPENDENT_AMBULATORY_CARE_PROVIDER_SITE_OTHER)
Admission: EM | Admit: 2011-12-03 | Discharge: 2011-12-03 | Disposition: A | Discharge status: Home / Self Care | Payer: Medicare Other | Source: Home / Self Care | Attending: Family Medicine | Admitting: Family Medicine

## 2011-12-03 DIAGNOSIS — S0083XA Contusion of other part of head, initial encounter: Secondary | ICD-10-CM

## 2011-12-03 DIAGNOSIS — S0093XA Contusion of unspecified part of head, initial encounter: Secondary | ICD-10-CM

## 2011-12-03 DIAGNOSIS — S0183XA Puncture wound without foreign body of other part of head, initial encounter: Secondary | ICD-10-CM

## 2011-12-03 DIAGNOSIS — S0180XA Unspecified open wound of other part of head, initial encounter: Secondary | ICD-10-CM

## 2011-12-03 MED ORDER — TETANUS-DIPHTH-ACELL PERTUSSIS 5-2.5-18.5 LF-MCG/0.5 IM SUSP: 0.5000 mL | Freq: Once | INTRAMUSCULAR | Status: DC

## 2011-12-03 MED ORDER — TETANUS-DIPHTH-ACELL PERTUSSIS 5-2.5-18.5 LF-MCG/0.5 IM SUSP
INTRAMUSCULAR | Status: AC
  Filled 2011-12-03: qty 0.5

## 2011-12-03 NOTE — ED Notes (Signed)
Pt was removing a platter off the top of the refridgerator and it fell and hit pt on rt side of forehead, fifty cent size hematoma, denies nausea, dizziness and vomiting

## 2011-12-03 NOTE — ED Provider Notes (Signed)
History    Patient is here because of a  head injury. According to her son she was obtaining something off the top of the refrigerator when H. for any fell hitting her on the right side of her for hand. A small laceration was occurred as well as some swelling of the forehead but she denies any loss of consciousness dizziness nausea or blurry vision at this time.      CSN: [id number] Arrival date & time: 12/03/2011  5:43 PM   First MD Initiated Contact with Patient 12/03/11 1609      Chief Complaint  Patient presents with  . Head Injury    (Consider location/radiation/quality/duration/timing/severity/associated sxs/prior treatment) Patient is a 77 y.o. female presenting with head injury. The history is provided by the patient.  Head Injury  The incident occurred 3 to 5 hours ago. She came to the ER via walk-in. The injury mechanism was a direct blow. There was no loss of consciousness. The volume of blood lost was minimal. The quality of the pain is described as dull. The patient is experiencing no pain. Pertinent negatives include no numbness, no blurred vision, no vomiting, no tinnitus, no disorientation, no weakness and no memory loss. She has tried ice for the symptoms.    Past Medical History  Diagnosis Date  . Hypertension   . Asthma   . ALLERGIC RHINITIS   . Glaucoma   . Allergic conjunctivitis     Past Surgical History  Procedure Date  . Glaucoma surgery   . Total abdominal hysterectomy w/ bilateral salpingoophorectomy   . Dental extraction     Family History  Problem Relation Age of Onset  . Heart attack Father   . Heart attack Brother   . Heart attack Brother     History  Substance Use Topics  . Smoking status: Never Smoker   . Smokeless tobacco: Not on file  . Alcohol Use: No    OB History    Grav Para Term Preterm Abortions TAB SAB Ect Mult Living                  Review of Systems  HENT: Negative for tinnitus.   Eyes: Positive for discharge.  Negative for blurred vision.       She has had ongoing eye discharge she is seeing specialists for that and has not anything new that the injury  Gastrointestinal: Negative for vomiting.  Neurological: Negative for weakness and numbness.  Psychiatric/Behavioral: Negative for memory loss.  All other systems reviewed and are negative.    Allergies  Penicillins  Home Medications   Current Outpatient Rx  Name Route Sig Dispense Refill  . ALBUTEROL SULFATE HFA 108 (90 BASE) MCG/ACT IN AERS Inhalation Inhale 2 puffs into the lungs every 6 (six) hours as needed for wheezing. 1 Inhaler 3  . AMLODIPINE BESYLATE 5 MG PO TABS Oral Take 5 mg by mouth daily.      Marland Kitchen VITAMIN C 1000 MG PO TABS Oral Take 1,000 mg by mouth daily.      . ASPIRIN 81 MG PO TABS Oral Take 81 mg by mouth daily.      Marland Kitchen BETAXOLOL HCL 0.25 % OP SUSP Both Eyes Place 1 drop into both eyes 2 (two) times daily.      Marland Kitchen BIMATOPROST 0.03 % OP SOLN Right Eye Place 1 drop into the right eye at bedtime.      Marland Kitchen CALCIUM CARBONATE 600 MG PO TABS Oral Take 600 mg by  mouth daily.      Marland Kitchen CARBOXYMETHYLCELLUL-GLYCERIN 0.5-0.9 % OP SOLN  Use as directed     . CARBOXYMETHYLCELLULOSE SODIUM 0.5 % OP SOLN Both Eyes Place 1 drop into both eyes 3 (three) times daily as needed.      Marland Kitchen CETIRIZINE HCL 10 MG PO TABS Oral Take 10 mg by mouth daily.      . DORZOLAMIDE HCL 2 % OP SOLN Both Eyes Place 1 drop into both eyes 2 (two) times daily.      Marland Kitchen EZETIMIBE-SIMVASTATIN 10-20 MG PO TABS Oral Take 1 tablet by mouth at bedtime.      Marland Kitchen FLAX SEED OIL 1000 MG PO CAPS Oral Take 1 capsule by mouth daily.      Marland Kitchen FOLIC ACID 800 MCG PO TABS Oral Take 800 mcg by mouth daily.      Marland Kitchen HYDROCHLOROTHIAZIDE 25 MG PO TABS Oral Take 25 mg by mouth daily.      . LUTEIN 20 MG PO CAPS Oral Take 1 capsule by mouth daily.      Marland Kitchen METOPROLOL SUCCINATE ER 100 MG PO TB24 Oral Take 100 mg by mouth daily.      Marland Kitchen NITROGLYCERIN 0.4 MG SL SUBL Sublingual Place 0.4 mg under the tongue  every 5 (five) minutes as needed.      . OFLOXACIN 0.3 % OP SOLN Ophthalmic Apply 1 drop to eye 4 (four) times daily.      Marland Kitchen FISH OIL 300 MG PO CAPS Oral Take 1 capsule by mouth daily.      Marland Kitchen POLYETHYL GLYCOL-PROPYL GLYCOL 0.4-0.3 % OP SOLN Ophthalmic Apply to eye.      Marland Kitchen POTASSIUM 99 MG PO TABS Oral Take 1 tablet by mouth daily.      Marland Kitchen BILBERRY PLUS PO CAPS Oral Take 1 capsule by mouth daily.      Marland Kitchen VITAMIN D (ERGOCALCIFEROL) PO Oral Take 1 tablet by mouth daily.        BP 172/73  Pulse 86  Temp(Src) 99.6 F (37.6 C) (Oral)  Resp 18  SpO2 98%  Physical Exam  Constitutional: She appears well-developed and well-nourished.       Elderly black female  HENT:  Right Ear: External ear normal.  Left Ear: External ear normal.       Hematoma over the right upper forehead pain apparent puncture wound present but not bleeding at all.  Eyes: Pupils are equal, round, and reactive to light. Right eye exhibits discharge.       Thick yellow discharge coming predominantly from the left  Neck: Normal range of motion. Neck supple.  Skin: Skin is warm.  Psychiatric: She has a normal mood and affect.      ED Course  Procedures (including critical care time)  Labs Reviewed - No data to display No results found.   No diagnosis found.    MDM          Hassan Rowan, MD 12/03/11 2126

## 2011-12-09 ENCOUNTER — Ambulatory Visit (INDEPENDENT_AMBULATORY_CARE_PROVIDER_SITE_OTHER): Payer: Medicare Other

## 2011-12-09 DIAGNOSIS — J309 Allergic rhinitis, unspecified: Secondary | ICD-10-CM

## 2011-12-16 ENCOUNTER — Ambulatory Visit (INDEPENDENT_AMBULATORY_CARE_PROVIDER_SITE_OTHER): Payer: Medicare Other

## 2011-12-16 DIAGNOSIS — J309 Allergic rhinitis, unspecified: Secondary | ICD-10-CM

## 2011-12-23 ENCOUNTER — Ambulatory Visit (INDEPENDENT_AMBULATORY_CARE_PROVIDER_SITE_OTHER): Payer: Medicare Other

## 2011-12-23 DIAGNOSIS — J309 Allergic rhinitis, unspecified: Secondary | ICD-10-CM

## 2011-12-30 ENCOUNTER — Ambulatory Visit (INDEPENDENT_AMBULATORY_CARE_PROVIDER_SITE_OTHER): Payer: Medicare Other

## 2011-12-30 DIAGNOSIS — J309 Allergic rhinitis, unspecified: Secondary | ICD-10-CM

## 2012-01-03 ENCOUNTER — Encounter: Payer: Self-pay | Admitting: Internal Medicine

## 2012-01-10 ENCOUNTER — Ambulatory Visit (INDEPENDENT_AMBULATORY_CARE_PROVIDER_SITE_OTHER): Payer: Medicare Other | Admitting: Internal Medicine

## 2012-01-10 ENCOUNTER — Encounter: Payer: Self-pay | Admitting: Internal Medicine

## 2012-01-10 VITALS — BP 124/80 | HR 58 | Ht 64.0 in | Wt 127.0 lb

## 2012-01-10 DIAGNOSIS — J301 Allergic rhinitis due to pollen: Secondary | ICD-10-CM

## 2012-01-10 DIAGNOSIS — H1045 Other chronic allergic conjunctivitis: Secondary | ICD-10-CM

## 2012-01-10 NOTE — Patient Instructions (Signed)
Sample Pataday eye drops for allergy- 1 drop in each eye once daily, if needed

## 2012-01-10 NOTE — Progress Notes (Signed)
Patient ID: Jeanette Montoya, female    DOB: [birth year], 78 y.o.   MRN: [id number]  HPI 07/12/11- 37 yoF never smoker followed for allergic rhinitis, allergic conjunctivitis, asthma, complicated by HBP. Last here February 03, 2011- note reviewed In the past week she has been having episodes of shortness of breath, hard to get satisfying breath, feeling somehow associated with right side of neck.. Maybe some cough, very scant mucus. Denies pain, palpitation, syncope. Proair didn't help. Today feels well. Can't identify an associated trigger, relief or situation. Spells last a few minutes, self limited. Denies swelling or aching in legs.  Still bothered by chronic mattering from eyes. The allergy shots help and she is choosing to continue. 1:10 GH  01/10/12- 88 yoF never smoker followed for allergic rhinitis, allergic conjunctivitis, asthma, complicated by HBP. She denies shortness of breath at this time has felt fairly stable with no cough. She still complains of thickened mucoid drainage from her eyes with little conjunctival burning or itching. She is treated for glaucoma.  Review of Systems Constitutional:   No-   weight loss, night sweats, fevers, chills, fatigue, lassitude. HEENT:   No-  headaches, difficulty swallowing, tooth/dental problems, sore throat,       No-  sneezing, itching, ear ache, nasal congestion, post nasal drip,  CV:  No-   chest pain, orthopnea, PND, swelling in lower extremities, anasarca, dizziness, palpitations Resp: No-   shortness of breath with exertion or at rest.              No-   productive cough,  No non-productive cough,  No- coughing up of blood.              No-   change in color of mucus.  No- wheezing.   Skin: No-   rash or lesions. GI:  No-   heartburn, indigestion, abdominal pain, nausea, vomiting, diarrhea,                 change in bowel habits, loss of appetite GU: MS:  No-   joint pain or swelling.  No- decreased range of motion.  No- back  pain. Neuro-     nothing unusual Psych:  No- change in mood or affect. No depression or anxiety.  No memory loss.      Objective:   Physical Exam General- Alert, Oriented, Affect-appropriate, Distress- none acute    Small, elderly lady Skin- rash-none, lesions- none, excoriation- none Lymphadenopathy- none Head- atraumatic            Eyes- Gross vision intact, PERRLA,  clear secretions. She is wiping at eyes, very small amounts of white mucus at corners .            Ears- Hearing, canals            Nose- Clear, No-Septal dev, mucus, polyps, erosion, perforation             Throat- Mallampati II , mucosa clear , drainage- none, tonsils- atrophic Neck- flexible , trachea midline, no stridor , thyroid nl, carotid no bruit Chest - symmetrical excursion , unlabored           Heart/CV- RRR , no murmur , no gallop  , no rub, nl s1 s2                           - JVD- none , edema- none, stasis changes- none, varices- none  Lung- clear to P&A, wheeze- none, cough- none , dullness-none, rub- none           Chest wall-  Abd-  Br/ Gen/ Rectal- Not done, not indicated Extrem- cyanosis- none, clubbing, none, atrophy- none, strength- nl Neuro- grossly intact to observation

## 2012-01-12 ENCOUNTER — Ambulatory Visit (INDEPENDENT_AMBULATORY_CARE_PROVIDER_SITE_OTHER): Payer: Medicare Other

## 2012-01-12 DIAGNOSIS — J309 Allergic rhinitis, unspecified: Secondary | ICD-10-CM

## 2012-01-13 NOTE — Assessment & Plan Note (Signed)
She is decided that allergy vaccine helps enough to continue. We discussed some available treatments that might help her eyes, but I don't want to interfere with her glaucoma management.

## 2012-01-20 ENCOUNTER — Ambulatory Visit (INDEPENDENT_AMBULATORY_CARE_PROVIDER_SITE_OTHER): Payer: Medicare Other

## 2012-01-20 DIAGNOSIS — J309 Allergic rhinitis, unspecified: Secondary | ICD-10-CM

## 2012-01-23 ENCOUNTER — Telehealth: Payer: Self-pay | Admitting: Internal Medicine

## 2012-01-23 MED ORDER — OLOPATADINE HCL 0.2 % OP SOLN: OPHTHALMIC | Status: DC

## 2012-01-23 NOTE — Telephone Encounter (Signed)
Rx has been sent to [facility]--ATC pt to make aware [facility]

## 2012-01-23 NOTE — Telephone Encounter (Signed)
Per Cy-okay to refill Pataday #1 1 drop in each eye daily as needed with prn refills.

## 2012-01-23 NOTE — Telephone Encounter (Signed)
I spoke with pataday rx called into the pharmacy. Pt states it helped. She was given a sample at last visit. Please advise if okay to send rx in for pt Dr. Maple Hudson, thanks   [facility]

## 2012-01-24 NOTE — Telephone Encounter (Signed)
I spoke with pt and is aware rx was sent to the pharmacy and nothing further was needed

## 2012-02-03 ENCOUNTER — Ambulatory Visit (INDEPENDENT_AMBULATORY_CARE_PROVIDER_SITE_OTHER): Payer: MEDICARE

## 2012-02-03 DIAGNOSIS — J309 Allergic rhinitis, unspecified: Secondary | ICD-10-CM

## 2012-02-09 ENCOUNTER — Ambulatory Visit: Payer: Medicare Other | Admitting: Internal Medicine

## 2012-02-17 ENCOUNTER — Ambulatory Visit (INDEPENDENT_AMBULATORY_CARE_PROVIDER_SITE_OTHER): Payer: MEDICARE

## 2012-02-17 DIAGNOSIS — J309 Allergic rhinitis, unspecified: Secondary | ICD-10-CM

## 2012-02-24 ENCOUNTER — Ambulatory Visit (INDEPENDENT_AMBULATORY_CARE_PROVIDER_SITE_OTHER): Payer: MEDICARE

## 2012-02-24 DIAGNOSIS — J309 Allergic rhinitis, unspecified: Secondary | ICD-10-CM

## 2012-03-02 ENCOUNTER — Ambulatory Visit (INDEPENDENT_AMBULATORY_CARE_PROVIDER_SITE_OTHER): Payer: MEDICARE

## 2012-03-02 DIAGNOSIS — J309 Allergic rhinitis, unspecified: Secondary | ICD-10-CM

## 2012-03-09 ENCOUNTER — Ambulatory Visit (INDEPENDENT_AMBULATORY_CARE_PROVIDER_SITE_OTHER): Payer: MEDICARE

## 2012-03-09 DIAGNOSIS — J309 Allergic rhinitis, unspecified: Secondary | ICD-10-CM

## 2012-03-14 ENCOUNTER — Encounter (HOSPITAL_COMMUNITY): Payer: Self-pay | Admitting: *Deleted

## 2012-03-14 ENCOUNTER — Emergency Department (HOSPITAL_COMMUNITY)
Admission: EM | Admit: 2012-03-14 | Discharge: 2012-03-14 | Disposition: A | Discharge status: Home / Self Care | Payer: Medicare Other | Attending: Emergency Medicine | Admitting: Emergency Medicine

## 2012-03-14 DIAGNOSIS — W19XXXA Unspecified fall, initial encounter: Secondary | ICD-10-CM | POA: Insufficient documentation

## 2012-03-14 DIAGNOSIS — Y998 Other external cause status: Secondary | ICD-10-CM | POA: Insufficient documentation

## 2012-03-14 DIAGNOSIS — S01501A Unspecified open wound of lip, initial encounter: Secondary | ICD-10-CM | POA: Insufficient documentation

## 2012-03-14 DIAGNOSIS — Y93K1 Activity, walking an animal: Secondary | ICD-10-CM | POA: Insufficient documentation

## 2012-03-14 NOTE — ED Notes (Signed)
Walking dog today and took off and pt fell down into grass.  No LOC.  Pt complains of headache, no nausea, no neck pain.  Bleeding controlled.  1/4 inch lac

## 2012-03-23 ENCOUNTER — Ambulatory Visit (INDEPENDENT_AMBULATORY_CARE_PROVIDER_SITE_OTHER): Payer: MEDICARE

## 2012-03-23 DIAGNOSIS — J309 Allergic rhinitis, unspecified: Secondary | ICD-10-CM

## 2012-03-30 ENCOUNTER — Ambulatory Visit (INDEPENDENT_AMBULATORY_CARE_PROVIDER_SITE_OTHER): Payer: MEDICARE

## 2012-03-30 DIAGNOSIS — J309 Allergic rhinitis, unspecified: Secondary | ICD-10-CM

## 2012-04-06 ENCOUNTER — Ambulatory Visit (INDEPENDENT_AMBULATORY_CARE_PROVIDER_SITE_OTHER): Payer: MEDICARE

## 2012-04-06 DIAGNOSIS — J309 Allergic rhinitis, unspecified: Secondary | ICD-10-CM

## 2012-04-13 ENCOUNTER — Ambulatory Visit (INDEPENDENT_AMBULATORY_CARE_PROVIDER_SITE_OTHER): Payer: MEDICARE

## 2012-04-13 DIAGNOSIS — J309 Allergic rhinitis, unspecified: Secondary | ICD-10-CM

## 2012-04-20 ENCOUNTER — Ambulatory Visit (INDEPENDENT_AMBULATORY_CARE_PROVIDER_SITE_OTHER): Payer: MEDICARE

## 2012-04-20 DIAGNOSIS — J309 Allergic rhinitis, unspecified: Secondary | ICD-10-CM

## 2012-04-27 ENCOUNTER — Ambulatory Visit (INDEPENDENT_AMBULATORY_CARE_PROVIDER_SITE_OTHER): Payer: MEDICARE

## 2012-04-27 DIAGNOSIS — J309 Allergic rhinitis, unspecified: Secondary | ICD-10-CM

## 2012-05-01 ENCOUNTER — Encounter: Payer: Self-pay | Admitting: Internal Medicine

## 2012-05-01 ENCOUNTER — Ambulatory Visit (INDEPENDENT_AMBULATORY_CARE_PROVIDER_SITE_OTHER): Payer: Medicare Other

## 2012-05-01 DIAGNOSIS — J309 Allergic rhinitis, unspecified: Secondary | ICD-10-CM

## 2012-05-04 ENCOUNTER — Ambulatory Visit (INDEPENDENT_AMBULATORY_CARE_PROVIDER_SITE_OTHER): Payer: MEDICARE

## 2012-05-04 DIAGNOSIS — J309 Allergic rhinitis, unspecified: Secondary | ICD-10-CM

## 2012-05-11 ENCOUNTER — Ambulatory Visit (INDEPENDENT_AMBULATORY_CARE_PROVIDER_SITE_OTHER): Payer: MEDICARE

## 2012-05-11 DIAGNOSIS — J309 Allergic rhinitis, unspecified: Secondary | ICD-10-CM

## 2012-05-18 ENCOUNTER — Ambulatory Visit (INDEPENDENT_AMBULATORY_CARE_PROVIDER_SITE_OTHER): Payer: MEDICARE

## 2012-05-18 DIAGNOSIS — J309 Allergic rhinitis, unspecified: Secondary | ICD-10-CM

## 2012-05-25 ENCOUNTER — Ambulatory Visit (INDEPENDENT_AMBULATORY_CARE_PROVIDER_SITE_OTHER): Payer: MEDICARE

## 2012-05-25 DIAGNOSIS — J309 Allergic rhinitis, unspecified: Secondary | ICD-10-CM

## 2012-06-01 ENCOUNTER — Ambulatory Visit (INDEPENDENT_AMBULATORY_CARE_PROVIDER_SITE_OTHER): Payer: Medicare Other

## 2012-06-01 DIAGNOSIS — J309 Allergic rhinitis, unspecified: Secondary | ICD-10-CM

## 2012-06-08 ENCOUNTER — Ambulatory Visit (INDEPENDENT_AMBULATORY_CARE_PROVIDER_SITE_OTHER): Payer: Medicare Other

## 2012-06-08 DIAGNOSIS — J309 Allergic rhinitis, unspecified: Secondary | ICD-10-CM

## 2012-06-22 ENCOUNTER — Ambulatory Visit (INDEPENDENT_AMBULATORY_CARE_PROVIDER_SITE_OTHER): Payer: MEDICARE

## 2012-06-22 DIAGNOSIS — J309 Allergic rhinitis, unspecified: Secondary | ICD-10-CM

## 2012-06-29 ENCOUNTER — Ambulatory Visit (INDEPENDENT_AMBULATORY_CARE_PROVIDER_SITE_OTHER): Payer: BC Managed Care – PPO

## 2012-06-29 DIAGNOSIS — J309 Allergic rhinitis, unspecified: Secondary | ICD-10-CM

## 2012-07-06 ENCOUNTER — Ambulatory Visit (INDEPENDENT_AMBULATORY_CARE_PROVIDER_SITE_OTHER): Payer: Medicare Other

## 2012-07-06 DIAGNOSIS — J309 Allergic rhinitis, unspecified: Secondary | ICD-10-CM

## 2012-07-09 ENCOUNTER — Ambulatory Visit (INDEPENDENT_AMBULATORY_CARE_PROVIDER_SITE_OTHER): Payer: MEDICARE | Admitting: Internal Medicine

## 2012-07-09 ENCOUNTER — Encounter: Payer: Self-pay | Admitting: Internal Medicine

## 2012-07-09 VITALS — BP 138/70 | HR 61 | Ht 64.0 in | Wt 132.6 lb

## 2012-07-09 DIAGNOSIS — J301 Allergic rhinitis due to pollen: Secondary | ICD-10-CM

## 2012-07-09 DIAGNOSIS — J45998 Other asthma: Secondary | ICD-10-CM

## 2012-07-09 DIAGNOSIS — J45909 Unspecified asthma, uncomplicated: Secondary | ICD-10-CM

## 2012-07-09 DIAGNOSIS — H1045 Other chronic allergic conjunctivitis: Secondary | ICD-10-CM

## 2012-07-09 NOTE — Progress Notes (Signed)
Patient ID: Jeanette Montoya, female    DOB: 1922-01-24, 78 y.o.   MRN: [id number]  HPI 07/12/11- 70 yoF never smoker followed for allergic rhinitis, allergic conjunctivitis, asthma, complicated by HBP. Last here February 03, 2011- note reviewed In the past week she has been having episodes of shortness of breath, hard to get satisfying breath, feeling somehow associated with right side of neck.. Maybe some cough, very scant mucus. Denies pain, palpitation, syncope. Proair didn't help. Today feels well. Can't identify an associated trigger, relief or situation. Spells last a few minutes, self limited. Denies swelling or aching in legs.  Still bothered by chronic mattering from eyes. The allergy shots help and she is choosing to continue. 1:10 GH  01/10/12- 88 yoF never smoker followed for allergic rhinitis, allergic conjunctivitis, asthma, complicated by HBP. She denies shortness of breath at this time has felt fairly stable with no cough. She still complains of thickened mucoid drainage from her eyes with little conjunctival burning or itching. She is treated for glaucoma.  07/09/12- 88 yoF never smoker followed for allergic rhinitis, allergic conjunctivitis, asthma, complicated by HBP. Denies any cough or sob at this time  Allergy problems were "not too bad" this spring but humidity is bothering her now. She says her allergy shots are "good for me". She has had a couple of lightheaded or fainting episodes with no injury. She can't describe them very well but they don't seem related to her breathing.  Review of Systems- HPI Constitutional:   No-   weight loss, night sweats, fevers, chills, fatigue, lassitude. HEENT:   No-  headaches, difficulty swallowing, tooth/dental problems, sore throat,       No-  sneezing, itching, ear ache, nasal congestion, post nasal drip,  CV:  No-   chest pain, orthopnea, PND, swelling in lower extremities, anasarca, dizziness, palpitations Resp: No-   shortness  of breath with exertion or at rest.              No-   productive cough,  No non-productive cough,  No- coughing up of blood.              No-   change in color of mucus.  No- wheezing.   Skin: No-   rash or lesions. GI:  No-   heartburn, indigestion, abdominal pain, nausea, vomiting,  GU: MS:  No-   joint pain or swelling.   Neuro-     nothing unusual Psych:  No- change in mood or affect. No depression or anxiety.  No memory loss.  Objective:   Physical Exam General- Alert, Oriented, Affect-appropriate, Distress- none acute    Small, elderly lady Skin- rash-none, lesions- none, excoriation- none Lymphadenopathy- none Head- atraumatic            Eyes- Gross vision intact, PERRLA,  clear secretions.             Ears- Hearing, canals            Nose- +pale mucosa, No-Septal dev, mucus, polyps, erosion, perforation             Throat- Mallampati II , mucosa clear , drainage- none, tonsils- atrophic Neck- flexible , trachea midline, no stridor , thyroid nl, carotid no bruit Chest - symmetrical excursion , unlabored           Heart/CV- RRR , no murmur , no gallop  , no rub, nl s1 s2                           -  JVD- none , edema- none, stasis changes- none, varices- none           Lung- clear to P&A, wheeze- none, cough- none , dullness-none, rub- none           Chest wall-  Abd-  Br/ Gen/ Rectal- Not done, not indicated Extrem- cyanosis- none, clubbing, none, atrophy- none, strength- nl Neuro- grossly intact to observation

## 2012-07-09 NOTE — Patient Instructions (Addendum)
Please call as needed  We can continue allergy vaccine now, since you find it helps. We can stop at any time.

## 2012-07-13 ENCOUNTER — Ambulatory Visit (INDEPENDENT_AMBULATORY_CARE_PROVIDER_SITE_OTHER): Payer: MEDICARE

## 2012-07-13 DIAGNOSIS — J309 Allergic rhinitis, unspecified: Secondary | ICD-10-CM

## 2012-07-13 NOTE — Assessment & Plan Note (Signed)
She is not complaining about her eyes today and not wiping at them.

## 2012-07-13 NOTE — Assessment & Plan Note (Signed)
She is convinced that allergy vaccine helps her even at her advanced age. We discussed this.

## 2012-07-13 NOTE — Assessment & Plan Note (Signed)
Good control. Humidity bothers her some but she does not wheeze.

## 2012-07-20 ENCOUNTER — Ambulatory Visit (INDEPENDENT_AMBULATORY_CARE_PROVIDER_SITE_OTHER): Payer: MEDICARE

## 2012-07-20 DIAGNOSIS — J309 Allergic rhinitis, unspecified: Secondary | ICD-10-CM

## 2012-07-23 ENCOUNTER — Ambulatory Visit: Payer: MEDICARE

## 2012-07-27 ENCOUNTER — Ambulatory Visit (INDEPENDENT_AMBULATORY_CARE_PROVIDER_SITE_OTHER): Payer: MEDICARE

## 2012-07-27 DIAGNOSIS — J309 Allergic rhinitis, unspecified: Secondary | ICD-10-CM

## 2012-08-03 ENCOUNTER — Ambulatory Visit (INDEPENDENT_AMBULATORY_CARE_PROVIDER_SITE_OTHER): Payer: Medicare Other

## 2012-08-03 DIAGNOSIS — J309 Allergic rhinitis, unspecified: Secondary | ICD-10-CM

## 2012-08-10 ENCOUNTER — Ambulatory Visit (INDEPENDENT_AMBULATORY_CARE_PROVIDER_SITE_OTHER): Payer: Medicare Other

## 2012-08-10 DIAGNOSIS — J309 Allergic rhinitis, unspecified: Secondary | ICD-10-CM

## 2012-08-17 ENCOUNTER — Ambulatory Visit (INDEPENDENT_AMBULATORY_CARE_PROVIDER_SITE_OTHER): Payer: Medicare Other

## 2012-08-17 DIAGNOSIS — J309 Allergic rhinitis, unspecified: Secondary | ICD-10-CM

## 2012-08-22 ENCOUNTER — Encounter: Payer: Self-pay | Admitting: Internal Medicine

## 2012-08-24 ENCOUNTER — Ambulatory Visit (INDEPENDENT_AMBULATORY_CARE_PROVIDER_SITE_OTHER): Payer: Medicare Other

## 2012-08-24 DIAGNOSIS — J309 Allergic rhinitis, unspecified: Secondary | ICD-10-CM

## 2012-08-31 ENCOUNTER — Ambulatory Visit (INDEPENDENT_AMBULATORY_CARE_PROVIDER_SITE_OTHER): Payer: Medicare Other

## 2012-08-31 DIAGNOSIS — J309 Allergic rhinitis, unspecified: Secondary | ICD-10-CM

## 2012-09-07 ENCOUNTER — Ambulatory Visit (INDEPENDENT_AMBULATORY_CARE_PROVIDER_SITE_OTHER): Payer: Medicare Other

## 2012-09-07 DIAGNOSIS — J309 Allergic rhinitis, unspecified: Secondary | ICD-10-CM

## 2012-09-11 ENCOUNTER — Ambulatory Visit: Payer: Medicare Other

## 2012-09-11 ENCOUNTER — Ambulatory Visit (INDEPENDENT_AMBULATORY_CARE_PROVIDER_SITE_OTHER): Payer: Medicare Other

## 2012-09-11 DIAGNOSIS — J309 Allergic rhinitis, unspecified: Secondary | ICD-10-CM

## 2012-09-14 ENCOUNTER — Ambulatory Visit: Payer: Medicare Other

## 2012-09-21 ENCOUNTER — Ambulatory Visit (INDEPENDENT_AMBULATORY_CARE_PROVIDER_SITE_OTHER): Payer: Medicare Other

## 2012-09-21 DIAGNOSIS — J309 Allergic rhinitis, unspecified: Secondary | ICD-10-CM

## 2012-09-28 ENCOUNTER — Ambulatory Visit (INDEPENDENT_AMBULATORY_CARE_PROVIDER_SITE_OTHER): Payer: Medicare Other

## 2012-09-28 DIAGNOSIS — J309 Allergic rhinitis, unspecified: Secondary | ICD-10-CM

## 2012-10-04 ENCOUNTER — Ambulatory Visit (INDEPENDENT_AMBULATORY_CARE_PROVIDER_SITE_OTHER): Payer: Medicare Other

## 2012-10-04 DIAGNOSIS — J309 Allergic rhinitis, unspecified: Secondary | ICD-10-CM

## 2012-10-12 ENCOUNTER — Ambulatory Visit (INDEPENDENT_AMBULATORY_CARE_PROVIDER_SITE_OTHER): Payer: Medicare Other

## 2012-10-12 DIAGNOSIS — J309 Allergic rhinitis, unspecified: Secondary | ICD-10-CM

## 2012-10-26 ENCOUNTER — Other Ambulatory Visit: Payer: Self-pay | Admitting: Neurology

## 2012-10-26 ENCOUNTER — Ambulatory Visit (INDEPENDENT_AMBULATORY_CARE_PROVIDER_SITE_OTHER): Payer: Medicare Other

## 2012-10-26 DIAGNOSIS — J309 Allergic rhinitis, unspecified: Secondary | ICD-10-CM

## 2012-10-26 DIAGNOSIS — I10 Essential (primary) hypertension: Secondary | ICD-10-CM

## 2012-10-26 DIAGNOSIS — F0390 Unspecified dementia without behavioral disturbance: Secondary | ICD-10-CM

## 2012-10-26 DIAGNOSIS — E785 Hyperlipidemia, unspecified: Secondary | ICD-10-CM

## 2012-11-02 ENCOUNTER — Ambulatory Visit (INDEPENDENT_AMBULATORY_CARE_PROVIDER_SITE_OTHER): Payer: Medicare Other

## 2012-11-02 ENCOUNTER — Ambulatory Visit
Admission: RE | Admit: 2012-11-02 | Discharge: 2012-11-02 | Disposition: A | Discharge status: Home / Self Care | Payer: Medicare Other | Source: Ambulatory Visit | Attending: Neurology | Admitting: Neurology

## 2012-11-02 DIAGNOSIS — J309 Allergic rhinitis, unspecified: Secondary | ICD-10-CM

## 2012-11-02 DIAGNOSIS — I10 Essential (primary) hypertension: Secondary | ICD-10-CM

## 2012-11-02 DIAGNOSIS — F0390 Unspecified dementia without behavioral disturbance: Secondary | ICD-10-CM

## 2012-11-02 DIAGNOSIS — E785 Hyperlipidemia, unspecified: Secondary | ICD-10-CM

## 2012-11-09 ENCOUNTER — Ambulatory Visit (INDEPENDENT_AMBULATORY_CARE_PROVIDER_SITE_OTHER): Payer: Medicare Other

## 2012-11-09 DIAGNOSIS — J309 Allergic rhinitis, unspecified: Secondary | ICD-10-CM

## 2012-11-23 ENCOUNTER — Ambulatory Visit (INDEPENDENT_AMBULATORY_CARE_PROVIDER_SITE_OTHER): Payer: Medicare Other

## 2012-11-23 DIAGNOSIS — J309 Allergic rhinitis, unspecified: Secondary | ICD-10-CM

## 2012-11-30 ENCOUNTER — Ambulatory Visit (INDEPENDENT_AMBULATORY_CARE_PROVIDER_SITE_OTHER): Payer: Medicare Other

## 2012-11-30 DIAGNOSIS — J309 Allergic rhinitis, unspecified: Secondary | ICD-10-CM

## 2012-12-07 ENCOUNTER — Ambulatory Visit (INDEPENDENT_AMBULATORY_CARE_PROVIDER_SITE_OTHER): Payer: Medicare Other

## 2012-12-07 DIAGNOSIS — J309 Allergic rhinitis, unspecified: Secondary | ICD-10-CM

## 2012-12-18 ENCOUNTER — Encounter: Payer: Self-pay | Admitting: Internal Medicine

## 2012-12-21 ENCOUNTER — Ambulatory Visit (INDEPENDENT_AMBULATORY_CARE_PROVIDER_SITE_OTHER): Payer: Medicare Other

## 2012-12-21 DIAGNOSIS — J309 Allergic rhinitis, unspecified: Secondary | ICD-10-CM

## 2012-12-28 ENCOUNTER — Ambulatory Visit (INDEPENDENT_AMBULATORY_CARE_PROVIDER_SITE_OTHER): Payer: Medicare Other

## 2012-12-28 DIAGNOSIS — J309 Allergic rhinitis, unspecified: Secondary | ICD-10-CM

## 2013-01-04 ENCOUNTER — Ambulatory Visit (INDEPENDENT_AMBULATORY_CARE_PROVIDER_SITE_OTHER): Payer: Medicare Other

## 2013-01-04 DIAGNOSIS — J309 Allergic rhinitis, unspecified: Secondary | ICD-10-CM

## 2013-01-07 ENCOUNTER — Ambulatory Visit: Payer: Medicare Other | Admitting: Internal Medicine

## 2013-01-08 ENCOUNTER — Telehealth: Payer: Self-pay | Admitting: Internal Medicine

## 2013-01-08 NOTE — Telephone Encounter (Signed)
Per CY-pt missed OV yesterday with him; please call pt to see how she is doing and [facility] appt.

## 2013-01-08 NOTE — Telephone Encounter (Signed)
Mrs.Hollinger said she had an appt. with you this wk.. I thought she saw you yesterday. I would have sent  this to you yesterday,Friday was crazy every time I thouhgt I had a chance the bell would ring. Anyway Mrs. Solazzo is going have eye surgery,Dr.Groat's([facility] nurse is going to call her and give her a surgery date. She wanted you to know. Please call pt. for more details.

## 2013-01-09 NOTE — Telephone Encounter (Signed)
Spoke with patient's son-aware that patient has appt on Friday 01-11-13 at 10 am. Will discuss her concerns with CY then about upcoming surgery.

## 2013-01-11 ENCOUNTER — Ambulatory Visit (INDEPENDENT_AMBULATORY_CARE_PROVIDER_SITE_OTHER): Payer: Medicare Other

## 2013-01-11 ENCOUNTER — Ambulatory Visit: Payer: MEDICARE | Admitting: Internal Medicine

## 2013-01-11 ENCOUNTER — Encounter: Payer: Self-pay | Admitting: Internal Medicine

## 2013-01-11 ENCOUNTER — Ambulatory Visit (INDEPENDENT_AMBULATORY_CARE_PROVIDER_SITE_OTHER): Payer: Medicare Other | Admitting: Internal Medicine

## 2013-01-11 VITALS — BP 140/70 | HR 56 | Ht 64.0 in | Wt 122.8 lb

## 2013-01-11 DIAGNOSIS — J45909 Unspecified asthma, uncomplicated: Secondary | ICD-10-CM

## 2013-01-11 DIAGNOSIS — J309 Allergic rhinitis, unspecified: Secondary | ICD-10-CM

## 2013-01-11 DIAGNOSIS — J45998 Other asthma: Secondary | ICD-10-CM

## 2013-01-11 DIAGNOSIS — J301 Allergic rhinitis due to pollen: Secondary | ICD-10-CM

## 2013-01-11 NOTE — Progress Notes (Signed)
Patient ID: Jeanette Montoya, female    DOB: Nov 13, 1922, 79 y.o.   MRN: [id number]  HPI 07/12/11- 28 yoF never smoker followed for allergic rhinitis, allergic conjunctivitis, asthma, complicated by HBP. Last here February 03, 2011- note reviewed In the past week she has been having episodes of shortness of breath, hard to get satisfying breath, feeling somehow associated with right side of neck.. Maybe some cough, very scant mucus. Denies pain, palpitation, syncope. Proair didn't help. Today feels well. Can't identify an associated trigger, relief or situation. Spells last a few minutes, self limited. Denies swelling or aching in legs.  Still bothered by chronic mattering from eyes. The allergy shots help and she is choosing to continue. 1:10 GH  01/10/12- 88 yoF never smoker followed for allergic rhinitis, allergic conjunctivitis, asthma, complicated by HBP. She denies shortness of breath at this time has felt fairly stable with no cough. She still complains of thickened mucoid drainage from her eyes with little conjunctival burning or itching. She is treated for glaucoma.  07/09/12- 88 yoF never smoker followed for allergic rhinitis, allergic conjunctivitis, asthma, complicated by HBP. Denies any cough or sob at this time  Allergy problems were "not too bad" this spring but humidity is bothering her now. She says her allergy shots are "good for me". She has had a couple of lightheaded or fainting episodes with no injury. She can't describe them very well but they don't seem related to her breathing.  01/11/13  88 yoF never smoker followed for allergic rhinitis, allergic conjunctivitis, asthma, complicated by HBP. Here with a family member FOLLOWS FOR: still on vaccine 1:10 GH and doing well; denies any flare ups or reactions at this time. Has not wheezed in a long time and no longer has rescue inhaler. For surgery on tear duct left eye, which may help her chronic complaint of "matter" from her  eye.  Review of Systems- HPI Constitutional:   No-   weight loss, night sweats, fevers, chills, fatigue, lassitude. HEENT:   No-  headaches, difficulty swallowing, tooth/dental problems, sore throat,       No-  sneezing, itching, ear ache, nasal congestion, post nasal drip,  CV:  No-   chest pain, orthopnea, PND, swelling in lower extremities, anasarca, dizziness, palpitations Resp: No-   shortness of breath with exertion or at rest.              No-   productive cough,  No non-productive cough,  No- coughing up of blood.              No-   change in color of mucus.  No- wheezing.   Skin: No-   rash or lesions. GI:  No-   heartburn, indigestion, abdominal pain, nausea, vomiting,  GU: MS:  No-   joint pain or swelling.   Neuro-     nothing unusual Psych:  No- change in mood or affect. No depression or anxiety.  No memory loss.  Objective:   Physical Exam General- Alert, Oriented, Affect-appropriate, Distress- none acute    Small, elderly lady Skin- rash-none, lesions- none, excoriation- none Lymphadenopathy- none Head- atraumatic            Eyes- Gross vision intact, PERRLA,  clear secretions.             Ears- Hearing, canals            Nose- +pale mucosa, No-Septal dev, mucus, polyps, erosion, perforation  Throat- Mallampati II , mucosa clear , drainage- none, tonsils- atrophic Neck- flexible , trachea midline, no stridor , thyroid nl, carotid no bruit Chest - symmetrical excursion , unlabored           Heart/CV- RRR , no murmur , no gallop  , no rub, nl s1 s2                           - JVD- none , edema- none, stasis changes- none, varices- none           Lung- clear to P&A, wheeze- none, cough- none , dullness-none, rub- none           Chest wall-  Abd-  Br/ Gen/ Rectal- Not done, not indicated Extrem- cyanosis- none, clubbing, none, atrophy- none, strength- nl Neuro- grossly intact to observation

## 2013-01-11 NOTE — Patient Instructions (Addendum)
We can continue the allergy shots as long as they seem helpful  Please call as neeeded

## 2013-01-18 ENCOUNTER — Ambulatory Visit (INDEPENDENT_AMBULATORY_CARE_PROVIDER_SITE_OTHER): Payer: Medicare Other

## 2013-01-18 DIAGNOSIS — J309 Allergic rhinitis, unspecified: Secondary | ICD-10-CM

## 2013-01-22 NOTE — Assessment & Plan Note (Signed)
Well controlled now and no longer demanding much attention.

## 2013-01-22 NOTE — Assessment & Plan Note (Signed)
Conversation about whether to continue allergy vaccine. At her age it has likely done most of the good it can do and her exposure to triggers is reduced. We agreed to reassess in spring pollen season. I think she feels a little more confident taking it.

## 2013-01-25 ENCOUNTER — Ambulatory Visit (INDEPENDENT_AMBULATORY_CARE_PROVIDER_SITE_OTHER): Payer: Medicare Other

## 2013-01-25 DIAGNOSIS — J309 Allergic rhinitis, unspecified: Secondary | ICD-10-CM

## 2013-02-01 ENCOUNTER — Ambulatory Visit: Payer: Medicare Other

## 2013-02-08 ENCOUNTER — Ambulatory Visit (INDEPENDENT_AMBULATORY_CARE_PROVIDER_SITE_OTHER): Payer: Medicare Other

## 2013-02-08 DIAGNOSIS — J309 Allergic rhinitis, unspecified: Secondary | ICD-10-CM

## 2013-02-15 ENCOUNTER — Ambulatory Visit (INDEPENDENT_AMBULATORY_CARE_PROVIDER_SITE_OTHER): Payer: Medicare Other

## 2013-02-15 DIAGNOSIS — J309 Allergic rhinitis, unspecified: Secondary | ICD-10-CM

## 2013-02-20 ENCOUNTER — Ambulatory Visit (INDEPENDENT_AMBULATORY_CARE_PROVIDER_SITE_OTHER): Payer: Medicare Other

## 2013-02-20 DIAGNOSIS — J309 Allergic rhinitis, unspecified: Secondary | ICD-10-CM

## 2013-02-22 ENCOUNTER — Ambulatory Visit: Payer: Medicare Other

## 2013-03-01 ENCOUNTER — Ambulatory Visit (INDEPENDENT_AMBULATORY_CARE_PROVIDER_SITE_OTHER): Payer: Medicare Other

## 2013-03-01 DIAGNOSIS — J309 Allergic rhinitis, unspecified: Secondary | ICD-10-CM

## 2013-03-08 ENCOUNTER — Ambulatory Visit (INDEPENDENT_AMBULATORY_CARE_PROVIDER_SITE_OTHER): Payer: Medicare Other

## 2013-03-08 DIAGNOSIS — J309 Allergic rhinitis, unspecified: Secondary | ICD-10-CM

## 2013-03-15 ENCOUNTER — Ambulatory Visit (INDEPENDENT_AMBULATORY_CARE_PROVIDER_SITE_OTHER): Payer: Medicare Other

## 2013-03-15 DIAGNOSIS — J309 Allergic rhinitis, unspecified: Secondary | ICD-10-CM

## 2013-03-22 ENCOUNTER — Ambulatory Visit (INDEPENDENT_AMBULATORY_CARE_PROVIDER_SITE_OTHER): Payer: Medicare Other

## 2013-03-22 DIAGNOSIS — J309 Allergic rhinitis, unspecified: Secondary | ICD-10-CM

## 2013-03-29 ENCOUNTER — Ambulatory Visit (INDEPENDENT_AMBULATORY_CARE_PROVIDER_SITE_OTHER): Payer: Medicare Other

## 2013-03-29 DIAGNOSIS — J309 Allergic rhinitis, unspecified: Secondary | ICD-10-CM

## 2013-04-08 ENCOUNTER — Ambulatory Visit (INDEPENDENT_AMBULATORY_CARE_PROVIDER_SITE_OTHER): Payer: Medicare Other

## 2013-04-08 DIAGNOSIS — J309 Allergic rhinitis, unspecified: Secondary | ICD-10-CM

## 2013-04-12 ENCOUNTER — Encounter: Payer: Self-pay | Admitting: Internal Medicine

## 2013-04-12 ENCOUNTER — Ambulatory Visit (INDEPENDENT_AMBULATORY_CARE_PROVIDER_SITE_OTHER): Payer: Medicare Other | Admitting: Internal Medicine

## 2013-04-12 VITALS — BP 122/60 | HR 54 | Ht 61.75 in | Wt 129.6 lb

## 2013-04-12 DIAGNOSIS — J45909 Unspecified asthma, uncomplicated: Secondary | ICD-10-CM

## 2013-04-12 DIAGNOSIS — J45998 Other asthma: Secondary | ICD-10-CM

## 2013-04-12 DIAGNOSIS — H1045 Other chronic allergic conjunctivitis: Secondary | ICD-10-CM

## 2013-04-12 DIAGNOSIS — J301 Allergic rhinitis due to pollen: Secondary | ICD-10-CM

## 2013-04-12 NOTE — Progress Notes (Signed)
Patient ID: Jeanette Montoya, female    DOB: 25-Jan-1922, 79 y.o.   MRN: [id number]  HPI 07/12/11- 63 yoF never smoker followed for allergic rhinitis, allergic conjunctivitis, asthma, complicated by HBP. Last here February 03, 2011- note reviewed In the past week she has been having episodes of shortness of breath, hard to get satisfying breath, feeling somehow associated with right side of neck.. Maybe some cough, very scant mucus. Denies pain, palpitation, syncope. Proair didn't help. Today feels well. Can't identify an associated trigger, relief or situation. Spells last a few minutes, self limited. Denies swelling or aching in legs.  Still bothered by chronic mattering from eyes. The allergy shots help and she is choosing to continue. 1:10 GH  01/10/12- 88 yoF never smoker followed for allergic rhinitis, allergic conjunctivitis, asthma, complicated by HBP. She denies shortness of breath at this time has felt fairly stable with no cough. She still complains of thickened mucoid drainage from her eyes with little conjunctival burning or itching. She is treated for glaucoma.  07/09/12- 88 yoF never smoker followed for allergic rhinitis, allergic conjunctivitis, asthma, complicated by HBP. Denies any cough or sob at this time  Allergy problems were "not too bad" this spring but humidity is bothering her now. She says her allergy shots are "good for me". She has had a couple of lightheaded or fainting episodes with no injury. She can't describe them very well but they don't seem related to her breathing.  01/11/13  88 yoF never smoker followed for allergic rhinitis, allergic conjunctivitis, asthma, complicated by HBP. Here with a family member FOLLOWS FOR: still on vaccine 1:10 GH and doing well; denies any flare ups or reactions at this time. Has not wheezed in a long time and no longer has rescue inhaler. For surgery on tear duct left eye, which may help her chronic complaint of "matter" from her  eye.  04/12/13- 88 yoF never smoker followed for allergic rhinitis, allergic conjunctivitis, asthma, complicated by HBP. Here with a family member FOLLOWS FOR: still on vaccine and doing well; denies any flare ups with pollen increase at this time. Doing well. Continues allergy vaccine 1:10 GH. Needing only occasional zyrtec this Spring.  Review of Systems- HPI Constitutional:   No-   weight loss, night sweats, fevers, chills, fatigue, lassitude. HEENT:   No-  headaches, difficulty swallowing, tooth/dental problems, sore throat,       No-  sneezing, itching, ear ache, nasal congestion, post nasal drip,  CV:  No-   chest pain, orthopnea, PND, swelling in lower extremities, anasarca, dizziness, palpitations Resp: No-   shortness of breath with exertion or at rest.              No-   productive cough,  No non-productive cough,  No- coughing up of blood.              No-   change in color of mucus.  No- wheezing.   Skin: No-   rash or lesions. GI:  No-   heartburn, indigestion, abdominal pain, nausea, vomiting,  GU: MS:  No-   joint pain or swelling.   Neuro-     nothing unusual Psych:  No- change in mood or affect. No depression or anxiety.  No memory loss.  Objective:   Physical Exam General- Alert, Oriented, Affect-appropriate, Distress- none acute    Small, elderly lady Skin- rash-none, lesions- none, excoriation- none Lymphadenopathy- none Head- atraumatic  Eyes- Gross vision intact, PERRLA,  clear secretions with minor watering, not injected..             Ears- Hearing, canals            Nose- +pale mucosa, No-Septal dev, mucus, polyps, erosion, perforation             Throat- Mallampati II , mucosa clear , drainage- none, tonsils- atrophic Neck- flexible , trachea midline, no stridor , thyroid nl, carotid no bruit Chest - symmetrical excursion , unlabored           Heart/CV- RRR , no murmur , no gallop  , no rub, nl s1 s2                           - JVD- none , edema-  none, stasis changes- none, varices- none           Lung- clear to P&A, wheeze- none, cough- none , dullness-none, rub- none           Chest wall-  Abd-  Br/ Gen/ Rectal- Not done, not indicated Extrem- cyanosis- none, clubbing, none, atrophy- none, strength- nl Neuro- grossly intact to observation

## 2013-04-12 NOTE — Patient Instructions (Addendum)
We can continue Allergy vaccine 1:10 GH  Ok to take Zyrtec as needed  Consider calling the [facility] specialty eye clinic on [facility], to see if your Dr Ophelia Charter has a clinic day there. It would be more convenient for you.

## 2013-04-19 ENCOUNTER — Ambulatory Visit (INDEPENDENT_AMBULATORY_CARE_PROVIDER_SITE_OTHER): Payer: Medicare Other

## 2013-04-19 DIAGNOSIS — J309 Allergic rhinitis, unspecified: Secondary | ICD-10-CM

## 2013-04-19 NOTE — Assessment & Plan Note (Addendum)
Acceptable watering. She feels controlled. Going also to [facility] eye clinic for glaucoma. She would like to be seen at their [place] office if possible.

## 2013-04-19 NOTE — Assessment & Plan Note (Signed)
Well controlled so far this spring. Discussed meds.

## 2013-04-19 NOTE — Assessment & Plan Note (Signed)
Continues satisfied with allergy vaccine as a treatment. No change needed.

## 2013-04-26 ENCOUNTER — Ambulatory Visit (INDEPENDENT_AMBULATORY_CARE_PROVIDER_SITE_OTHER): Payer: Medicare Other

## 2013-04-26 DIAGNOSIS — J309 Allergic rhinitis, unspecified: Secondary | ICD-10-CM

## 2013-04-30 ENCOUNTER — Ambulatory Visit: Payer: Medicare Other | Admitting: Pulmonary Disease

## 2013-05-03 ENCOUNTER — Ambulatory Visit (INDEPENDENT_AMBULATORY_CARE_PROVIDER_SITE_OTHER): Payer: Medicare Other

## 2013-05-03 DIAGNOSIS — J309 Allergic rhinitis, unspecified: Secondary | ICD-10-CM

## 2013-05-06 ENCOUNTER — Encounter: Payer: Self-pay | Admitting: Internal Medicine

## 2013-05-10 ENCOUNTER — Ambulatory Visit (INDEPENDENT_AMBULATORY_CARE_PROVIDER_SITE_OTHER): Payer: Medicare Other

## 2013-05-10 DIAGNOSIS — J309 Allergic rhinitis, unspecified: Secondary | ICD-10-CM

## 2013-05-17 ENCOUNTER — Ambulatory Visit (INDEPENDENT_AMBULATORY_CARE_PROVIDER_SITE_OTHER): Payer: Medicare Other

## 2013-05-17 DIAGNOSIS — J309 Allergic rhinitis, unspecified: Secondary | ICD-10-CM

## 2013-05-24 ENCOUNTER — Ambulatory Visit (INDEPENDENT_AMBULATORY_CARE_PROVIDER_SITE_OTHER): Payer: Medicare Other

## 2013-05-24 DIAGNOSIS — J309 Allergic rhinitis, unspecified: Secondary | ICD-10-CM

## 2013-05-31 ENCOUNTER — Ambulatory Visit (INDEPENDENT_AMBULATORY_CARE_PROVIDER_SITE_OTHER): Payer: Medicare Other

## 2013-05-31 DIAGNOSIS — J309 Allergic rhinitis, unspecified: Secondary | ICD-10-CM

## 2013-06-07 ENCOUNTER — Ambulatory Visit (INDEPENDENT_AMBULATORY_CARE_PROVIDER_SITE_OTHER): Payer: Medicare Other

## 2013-06-07 DIAGNOSIS — J309 Allergic rhinitis, unspecified: Secondary | ICD-10-CM

## 2013-06-14 ENCOUNTER — Ambulatory Visit (INDEPENDENT_AMBULATORY_CARE_PROVIDER_SITE_OTHER): Payer: Medicare Other

## 2013-06-14 DIAGNOSIS — J309 Allergic rhinitis, unspecified: Secondary | ICD-10-CM

## 2013-06-28 ENCOUNTER — Ambulatory Visit (INDEPENDENT_AMBULATORY_CARE_PROVIDER_SITE_OTHER): Payer: Medicare Other

## 2013-06-28 DIAGNOSIS — J309 Allergic rhinitis, unspecified: Secondary | ICD-10-CM

## 2013-07-01 ENCOUNTER — Ambulatory Visit (INDEPENDENT_AMBULATORY_CARE_PROVIDER_SITE_OTHER): Payer: Medicare Other

## 2013-07-01 DIAGNOSIS — J309 Allergic rhinitis, unspecified: Secondary | ICD-10-CM

## 2013-07-05 ENCOUNTER — Ambulatory Visit (INDEPENDENT_AMBULATORY_CARE_PROVIDER_SITE_OTHER): Payer: Medicare Other

## 2013-07-05 DIAGNOSIS — J309 Allergic rhinitis, unspecified: Secondary | ICD-10-CM

## 2013-07-12 ENCOUNTER — Ambulatory Visit (INDEPENDENT_AMBULATORY_CARE_PROVIDER_SITE_OTHER): Payer: Medicare Other

## 2013-07-12 DIAGNOSIS — J309 Allergic rhinitis, unspecified: Secondary | ICD-10-CM

## 2013-07-19 ENCOUNTER — Ambulatory Visit (INDEPENDENT_AMBULATORY_CARE_PROVIDER_SITE_OTHER): Payer: Medicare Other

## 2013-07-19 DIAGNOSIS — J309 Allergic rhinitis, unspecified: Secondary | ICD-10-CM

## 2013-07-26 ENCOUNTER — Ambulatory Visit (INDEPENDENT_AMBULATORY_CARE_PROVIDER_SITE_OTHER): Payer: Medicare Other

## 2013-07-26 DIAGNOSIS — J309 Allergic rhinitis, unspecified: Secondary | ICD-10-CM

## 2013-08-02 ENCOUNTER — Ambulatory Visit (INDEPENDENT_AMBULATORY_CARE_PROVIDER_SITE_OTHER): Payer: Medicare Other

## 2013-08-02 DIAGNOSIS — J309 Allergic rhinitis, unspecified: Secondary | ICD-10-CM

## 2013-08-09 ENCOUNTER — Ambulatory Visit (INDEPENDENT_AMBULATORY_CARE_PROVIDER_SITE_OTHER): Payer: Medicare Other

## 2013-08-09 DIAGNOSIS — J309 Allergic rhinitis, unspecified: Secondary | ICD-10-CM

## 2013-08-15 ENCOUNTER — Encounter: Payer: Self-pay | Admitting: Nurse Practitioner

## 2013-08-15 ENCOUNTER — Ambulatory Visit (INDEPENDENT_AMBULATORY_CARE_PROVIDER_SITE_OTHER): Payer: Medicare Other | Admitting: Nurse Practitioner

## 2013-08-15 VITALS — BP 126/61 | HR 61 | Ht 61.0 in | Wt 131.0 lb

## 2013-08-15 DIAGNOSIS — F068 Other specified mental disorders due to known physiological condition: Secondary | ICD-10-CM

## 2013-08-15 DIAGNOSIS — I10 Essential (primary) hypertension: Secondary | ICD-10-CM

## 2013-08-15 MED ORDER — DONEPEZIL HCL 10 MG PO TABS: 10.0000 mg | ORAL_TABLET | Freq: Every day | ORAL | Status: DC
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
## 2013-08-15 NOTE — Patient Instructions (Addendum)
Continue Aricept at current dose Followup in 6 months Will renew medication

## 2013-08-16 ENCOUNTER — Ambulatory Visit (INDEPENDENT_AMBULATORY_CARE_PROVIDER_SITE_OTHER): Payer: Medicare Other

## 2013-08-16 DIAGNOSIS — J309 Allergic rhinitis, unspecified: Secondary | ICD-10-CM

## 2013-08-23 ENCOUNTER — Ambulatory Visit (INDEPENDENT_AMBULATORY_CARE_PROVIDER_SITE_OTHER): Payer: Medicare Other

## 2013-08-23 DIAGNOSIS — J309 Allergic rhinitis, unspecified: Secondary | ICD-10-CM

## 2013-08-30 ENCOUNTER — Ambulatory Visit (INDEPENDENT_AMBULATORY_CARE_PROVIDER_SITE_OTHER): Payer: Medicare Other

## 2013-08-30 DIAGNOSIS — J309 Allergic rhinitis, unspecified: Secondary | ICD-10-CM

## 2013-09-06 ENCOUNTER — Ambulatory Visit (INDEPENDENT_AMBULATORY_CARE_PROVIDER_SITE_OTHER): Payer: Medicare Other

## 2013-09-06 DIAGNOSIS — J309 Allergic rhinitis, unspecified: Secondary | ICD-10-CM

## 2013-09-13 ENCOUNTER — Ambulatory Visit (INDEPENDENT_AMBULATORY_CARE_PROVIDER_SITE_OTHER): Payer: Medicare Other

## 2013-09-13 DIAGNOSIS — J309 Allergic rhinitis, unspecified: Secondary | ICD-10-CM

## 2013-09-20 ENCOUNTER — Ambulatory Visit (INDEPENDENT_AMBULATORY_CARE_PROVIDER_SITE_OTHER): Payer: Medicare Other

## 2013-09-20 DIAGNOSIS — J309 Allergic rhinitis, unspecified: Secondary | ICD-10-CM

## 2013-09-27 ENCOUNTER — Ambulatory Visit (INDEPENDENT_AMBULATORY_CARE_PROVIDER_SITE_OTHER): Payer: Medicare Other

## 2013-09-27 DIAGNOSIS — J309 Allergic rhinitis, unspecified: Secondary | ICD-10-CM

## 2013-10-04 ENCOUNTER — Ambulatory Visit (INDEPENDENT_AMBULATORY_CARE_PROVIDER_SITE_OTHER): Payer: Medicare Other

## 2013-10-04 DIAGNOSIS — J309 Allergic rhinitis, unspecified: Secondary | ICD-10-CM

## 2013-10-11 ENCOUNTER — Ambulatory Visit (INDEPENDENT_AMBULATORY_CARE_PROVIDER_SITE_OTHER): Payer: Medicare Other

## 2013-10-11 DIAGNOSIS — J309 Allergic rhinitis, unspecified: Secondary | ICD-10-CM

## 2013-10-18 ENCOUNTER — Ambulatory Visit: Payer: Medicare Other

## 2013-10-18 ENCOUNTER — Ambulatory Visit (INDEPENDENT_AMBULATORY_CARE_PROVIDER_SITE_OTHER): Payer: Medicare Other | Admitting: Internal Medicine

## 2013-10-18 ENCOUNTER — Encounter: Payer: Self-pay | Admitting: Internal Medicine

## 2013-10-18 VITALS — BP 120/78 | HR 57 | Ht 62.0 in | Wt 128.0 lb

## 2013-10-18 DIAGNOSIS — H1045 Other chronic allergic conjunctivitis: Secondary | ICD-10-CM

## 2013-10-18 DIAGNOSIS — J45909 Unspecified asthma, uncomplicated: Secondary | ICD-10-CM

## 2013-10-18 DIAGNOSIS — J301 Allergic rhinitis due to pollen: Secondary | ICD-10-CM

## 2013-10-18 DIAGNOSIS — J45998 Other asthma: Secondary | ICD-10-CM

## 2013-10-18 NOTE — Assessment & Plan Note (Signed)
She can use antihistamines if needed. Time to stop allergy vaccine and see how she does without it

## 2013-10-18 NOTE — Assessment & Plan Note (Signed)
She still notices some mucus discharge in the corners of her eyes without itching

## 2013-10-18 NOTE — Progress Notes (Signed)
Patient ID: Jeanette Montoya, female    DOB: [birth year], 79 y.o.   MRN: [id number]  HPI 07/12/11- 79 yoF never smoker followed for allergic rhinitis, allergic conjunctivitis, asthma, complicated by HBP. Last here February 03, 2011- note reviewed In the past week she has been having episodes of shortness of breath, hard to get satisfying breath, feeling somehow associated with right side of neck.. Maybe some cough, very scant mucus. Denies pain, palpitation, syncope. Proair didn't help. Today feels well. Can't identify an associated trigger, relief or situation. Spells last a few minutes, self limited. Denies swelling or aching in legs.  Still bothered by chronic mattering from eyes. The allergy shots help and she is choosing to continue. 1:10 GH  01/10/12- 79 yoF never smoker followed for allergic rhinitis, allergic conjunctivitis, asthma, complicated by HBP. She denies shortness of breath at this time has felt fairly stable with no cough. She still complains of thickened mucoid drainage from her eyes with little conjunctival burning or itching. She is treated for glaucoma.  07/09/12- 79 yoF never smoker followed for allergic rhinitis, allergic conjunctivitis, asthma, complicated by HBP. Denies any cough or sob at this time  Allergy problems were "not too bad" this spring but humidity is bothering her now. She says her allergy shots are "good for me". She has had a couple of lightheaded or fainting episodes with no injury. She can't describe them very well but they don't seem related to her breathing.  01/11/13  79 yoF never smoker followed for allergic rhinitis, allergic conjunctivitis, asthma, complicated by HBP. Here with a family member FOLLOWS FOR: still on vaccine 1:10 GH and doing well; denies any flare ups or reactions at this time. Has not wheezed in a long time and no longer has rescue inhaler. For surgery on tear duct left eye, which may help her chronic complaint of "matter" from her  eye.  04/12/13- 79 yoF never smoker followed for allergic rhinitis, allergic conjunctivitis, asthma, complicated by HBP. Here with a family member FOLLOWS FOR: still on vaccine and doing well; denies any flare ups with pollen increase at this time. Doing well. Continues allergy vaccine 1:10 GH. Needing only occasional zyrtec this Spring.  10/18/13- 79 yoF never smoker followed for allergic rhinitis, allergic conjunctivitis, asthma, complicated by HBP. Here with son FOLLOWS FOR: still on allergy vaccine 1:10 GH and doing well; runny nose-clear. She feels she has done fairly well over the past 6 months with no acute events We discussed allergy vaccine and options for any histamines. I am recommending she stopped allergy vaccine because I think it has done all it can for her at this age   Review of Systems- HPI Constitutional:   No-   weight loss, night sweats, fevers, chills, fatigue, lassitude. HEENT:   No-  headaches, difficulty swallowing, tooth/dental problems, sore throat,       No-  sneezing, itching, ear ache, nasal congestion, post nasal drip,  CV:  No-   chest pain, orthopnea, PND, swelling in lower extremities, anasarca, dizziness, palpitations Resp: No-   shortness of breath with exertion or at rest.              No-   productive cough,  No non-productive cough,  No- coughing up of blood.              No-   change in color of mucus.  No- wheezing.   Skin: No-   rash or lesions. GI:  No-  heartburn, indigestion, abdominal pain, nausea, vomiting,  GU: MS:  No-   joint pain or swelling.   Neuro-     nothing unusual Psych:  No- change in mood or affect. No depression or anxiety.  No memory loss.  Objective:   Physical Exam General- Alert, Oriented, Affect-appropriate, Distress- none acute    Small, elderly lady Skin- rash-none, lesions- none, excoriation- none Lymphadenopathy- none Head- atraumatic            Eyes- Gross vision intact, PERRLA,  clear secretions with minor  watering, not injected..             Ears- Hearing, canals            Nose- +pale mucosa, No-Septal dev, mucus, polyps, erosion, perforation             Throat- Mallampati II , mucosa clear , drainage- none, tonsils- atrophic Neck- flexible , trachea midline, no stridor , thyroid nl, carotid no bruit Chest - symmetrical excursion , unlabored           Heart/CV- RRR , no murmur , no gallop  , no rub, nl s1 s2                           - JVD- none , edema- none, stasis changes- none, varices- none           Lung- clear to P&A, wheeze- none, cough- none , dullness-none, rub- none           Chest wall-  Abd-  Br/ Gen/ Rectal- Not done, not indicated Extrem- cyanosis- none, clubbing, none, atrophy- none, strength- nl Neuro- grossly intact to observation

## 2013-10-18 NOTE — Assessment & Plan Note (Signed)
controlled 

## 2013-10-18 NOTE — Patient Instructions (Signed)
We are going to keep coming for allergy shots until you use up your current supply, but then we will stop the shots and see how you do without them. You will always be able to take an otc antihistamine like Claritin/ lotratadine, if needed for allergies.

## 2013-10-25 ENCOUNTER — Ambulatory Visit (INDEPENDENT_AMBULATORY_CARE_PROVIDER_SITE_OTHER): Payer: Medicare Other

## 2013-10-25 DIAGNOSIS — J309 Allergic rhinitis, unspecified: Secondary | ICD-10-CM

## 2013-11-01 ENCOUNTER — Ambulatory Visit (INDEPENDENT_AMBULATORY_CARE_PROVIDER_SITE_OTHER): Payer: Medicare Other

## 2013-11-01 DIAGNOSIS — J309 Allergic rhinitis, unspecified: Secondary | ICD-10-CM

## 2013-11-08 ENCOUNTER — Ambulatory Visit (INDEPENDENT_AMBULATORY_CARE_PROVIDER_SITE_OTHER): Payer: Medicare Other

## 2013-11-08 DIAGNOSIS — J309 Allergic rhinitis, unspecified: Secondary | ICD-10-CM

## 2013-11-12 ENCOUNTER — Ambulatory Visit (INDEPENDENT_AMBULATORY_CARE_PROVIDER_SITE_OTHER): Payer: Medicare Other

## 2013-11-12 DIAGNOSIS — J309 Allergic rhinitis, unspecified: Secondary | ICD-10-CM

## 2013-11-15 ENCOUNTER — Ambulatory Visit (INDEPENDENT_AMBULATORY_CARE_PROVIDER_SITE_OTHER): Payer: Medicare Other

## 2013-11-15 DIAGNOSIS — J309 Allergic rhinitis, unspecified: Secondary | ICD-10-CM

## 2013-11-21 ENCOUNTER — Telehealth: Payer: Self-pay | Admitting: Neurology

## 2013-11-21 MED ORDER — DONEPEZIL HCL 10 MG PO TABS: 10.0000 mg | ORAL_TABLET | Freq: Every day | ORAL | Status: DC

## 2013-11-21 NOTE — Telephone Encounter (Signed)
We already sent [facility] refills on 08/28.  I will resend Rx.

## 2013-11-22 ENCOUNTER — Ambulatory Visit (INDEPENDENT_AMBULATORY_CARE_PROVIDER_SITE_OTHER): Payer: Medicare Other

## 2013-11-22 DIAGNOSIS — J309 Allergic rhinitis, unspecified: Secondary | ICD-10-CM

## 2013-11-29 ENCOUNTER — Ambulatory Visit (INDEPENDENT_AMBULATORY_CARE_PROVIDER_SITE_OTHER): Payer: Medicare Other

## 2013-11-29 DIAGNOSIS — J309 Allergic rhinitis, unspecified: Secondary | ICD-10-CM

## 2013-12-06 ENCOUNTER — Ambulatory Visit (INDEPENDENT_AMBULATORY_CARE_PROVIDER_SITE_OTHER): Payer: Medicare Other

## 2013-12-06 DIAGNOSIS — J309 Allergic rhinitis, unspecified: Secondary | ICD-10-CM

## 2013-12-13 ENCOUNTER — Ambulatory Visit (INDEPENDENT_AMBULATORY_CARE_PROVIDER_SITE_OTHER): Payer: Medicare Other

## 2013-12-13 DIAGNOSIS — J309 Allergic rhinitis, unspecified: Secondary | ICD-10-CM

## 2013-12-20 ENCOUNTER — Ambulatory Visit (INDEPENDENT_AMBULATORY_CARE_PROVIDER_SITE_OTHER): Payer: Medicare Other

## 2013-12-20 DIAGNOSIS — J309 Allergic rhinitis, unspecified: Secondary | ICD-10-CM

## 2013-12-27 ENCOUNTER — Ambulatory Visit (INDEPENDENT_AMBULATORY_CARE_PROVIDER_SITE_OTHER): Payer: Medicare Other

## 2013-12-27 DIAGNOSIS — J309 Allergic rhinitis, unspecified: Secondary | ICD-10-CM

## 2014-01-03 ENCOUNTER — Ambulatory Visit (INDEPENDENT_AMBULATORY_CARE_PROVIDER_SITE_OTHER): Payer: Medicare Other

## 2014-01-03 DIAGNOSIS — J309 Allergic rhinitis, unspecified: Secondary | ICD-10-CM

## 2014-01-10 ENCOUNTER — Ambulatory Visit (INDEPENDENT_AMBULATORY_CARE_PROVIDER_SITE_OTHER): Payer: Medicare Other

## 2014-01-10 DIAGNOSIS — J309 Allergic rhinitis, unspecified: Secondary | ICD-10-CM

## 2014-01-15 ENCOUNTER — Encounter: Payer: Self-pay | Admitting: Internal Medicine

## 2014-01-17 ENCOUNTER — Ambulatory Visit (INDEPENDENT_AMBULATORY_CARE_PROVIDER_SITE_OTHER): Payer: Medicare Other

## 2014-01-17 DIAGNOSIS — J309 Allergic rhinitis, unspecified: Secondary | ICD-10-CM

## 2014-01-24 ENCOUNTER — Ambulatory Visit (INDEPENDENT_AMBULATORY_CARE_PROVIDER_SITE_OTHER): Payer: Medicare Other

## 2014-01-24 DIAGNOSIS — J309 Allergic rhinitis, unspecified: Secondary | ICD-10-CM

## 2014-01-31 ENCOUNTER — Ambulatory Visit (INDEPENDENT_AMBULATORY_CARE_PROVIDER_SITE_OTHER): Payer: Medicare Other

## 2014-01-31 DIAGNOSIS — J309 Allergic rhinitis, unspecified: Secondary | ICD-10-CM

## 2014-02-07 ENCOUNTER — Ambulatory Visit (INDEPENDENT_AMBULATORY_CARE_PROVIDER_SITE_OTHER): Payer: Medicare Other

## 2014-02-07 DIAGNOSIS — J309 Allergic rhinitis, unspecified: Secondary | ICD-10-CM

## 2014-02-13 ENCOUNTER — Ambulatory Visit: Payer: Medicare Other | Admitting: Nurse Practitioner

## 2014-02-14 ENCOUNTER — Ambulatory Visit: Payer: Medicare Other

## 2014-02-21 ENCOUNTER — Ambulatory Visit (INDEPENDENT_AMBULATORY_CARE_PROVIDER_SITE_OTHER): Payer: Medicare Other

## 2014-02-21 DIAGNOSIS — J309 Allergic rhinitis, unspecified: Secondary | ICD-10-CM

## 2014-02-28 ENCOUNTER — Ambulatory Visit (INDEPENDENT_AMBULATORY_CARE_PROVIDER_SITE_OTHER): Payer: Medicare Other

## 2014-02-28 DIAGNOSIS — J309 Allergic rhinitis, unspecified: Secondary | ICD-10-CM

## 2014-03-07 ENCOUNTER — Ambulatory Visit (INDEPENDENT_AMBULATORY_CARE_PROVIDER_SITE_OTHER): Payer: Medicare Other

## 2014-03-07 DIAGNOSIS — J309 Allergic rhinitis, unspecified: Secondary | ICD-10-CM

## 2014-03-10 ENCOUNTER — Other Ambulatory Visit: Payer: Self-pay

## 2014-03-10 MED ORDER — DONEPEZIL HCL 10 MG PO TABS: 10.0000 mg | ORAL_TABLET | Freq: Every day | ORAL | Status: DC

## 2014-03-14 ENCOUNTER — Ambulatory Visit (INDEPENDENT_AMBULATORY_CARE_PROVIDER_SITE_OTHER): Payer: Medicare Other

## 2014-03-14 DIAGNOSIS — J309 Allergic rhinitis, unspecified: Secondary | ICD-10-CM

## 2014-03-20 ENCOUNTER — Ambulatory Visit: Payer: Medicare Other

## 2014-03-28 ENCOUNTER — Ambulatory Visit (INDEPENDENT_AMBULATORY_CARE_PROVIDER_SITE_OTHER): Payer: Medicare Other

## 2014-03-28 DIAGNOSIS — J309 Allergic rhinitis, unspecified: Secondary | ICD-10-CM

## 2014-04-04 ENCOUNTER — Ambulatory Visit (INDEPENDENT_AMBULATORY_CARE_PROVIDER_SITE_OTHER): Payer: Medicare Other

## 2014-04-04 DIAGNOSIS — J309 Allergic rhinitis, unspecified: Secondary | ICD-10-CM

## 2014-04-11 ENCOUNTER — Ambulatory Visit (INDEPENDENT_AMBULATORY_CARE_PROVIDER_SITE_OTHER): Payer: Medicare Other

## 2014-04-11 DIAGNOSIS — J309 Allergic rhinitis, unspecified: Secondary | ICD-10-CM

## 2014-04-17 ENCOUNTER — Ambulatory Visit (INDEPENDENT_AMBULATORY_CARE_PROVIDER_SITE_OTHER): Payer: Medicare Other | Admitting: Internal Medicine

## 2014-04-17 ENCOUNTER — Encounter: Payer: Self-pay | Admitting: Internal Medicine

## 2014-04-17 ENCOUNTER — Ambulatory Visit (INDEPENDENT_AMBULATORY_CARE_PROVIDER_SITE_OTHER): Payer: Medicare Other

## 2014-04-17 VITALS — BP 120/64 | HR 60 | Ht 60.0 in | Wt 130.0 lb

## 2014-04-17 DIAGNOSIS — J309 Allergic rhinitis, unspecified: Secondary | ICD-10-CM

## 2014-04-17 DIAGNOSIS — J45998 Other asthma: Secondary | ICD-10-CM

## 2014-04-17 DIAGNOSIS — J45909 Unspecified asthma, uncomplicated: Secondary | ICD-10-CM

## 2014-04-17 DIAGNOSIS — J301 Allergic rhinitis due to pollen: Secondary | ICD-10-CM

## 2014-04-17 NOTE — Patient Instructions (Signed)
Okay to start coming every other week to get your allergy shot. We can continue every other week until you run out of vaccine, and just not get any more.  You can stop allergy shots at any time- just let the allergy lab know that you are stopping

## 2014-04-17 NOTE — Progress Notes (Signed)
Patient ID: Jeanette Montoya, female    DOB: February 07, 1922, 80 y.o.   MRN: [id number]  HPI 07/12/11- [age] yoF never smoker followed for allergic rhinitis, allergic conjunctivitis, asthma, complicated by HBP. Last here February 03, 2011- note reviewed In the past week she has been having episodes of shortness of breath, hard to get satisfying breath, feeling somehow associated with right side of neck.. Maybe some cough, very scant mucus. Denies pain, palpitation, syncope. Proair didn't help. Today feels well. Can't identify an associated trigger, relief or situation. Spells last a few minutes, self limited. Denies swelling or aching in legs.  Still bothered by chronic mattering from eyes. The allergy shots help and she is choosing to continue. 1:10 GH  01/10/12- 88 yoF never smoker followed for allergic rhinitis, allergic conjunctivitis, asthma, complicated by HBP. She denies shortness of breath at this time has felt fairly stable with no cough. She still complains of thickened mucoid drainage from her eyes with little conjunctival burning or itching. She is treated for glaucoma.  07/09/12- 88 yoF never smoker followed for allergic rhinitis, allergic conjunctivitis, asthma, complicated by HBP. Denies any cough or sob at this time  Allergy problems were "not too bad" this spring but humidity is bothering her now. She says her allergy shots are "good for me". She has had a couple of lightheaded or fainting episodes with no injury. She can't describe them very well but they don't seem related to her breathing.  01/11/13  88 yoF never smoker followed for allergic rhinitis, allergic conjunctivitis, asthma, complicated by HBP. Here with a family member FOLLOWS FOR: still on vaccine 1:10 GH and doing well; denies any flare ups or reactions at this time. Has not wheezed in a long time and no longer has rescue inhaler. For surgery on tear duct left eye, which may help her chronic complaint of "matter" from her  eye.  04/12/13- 88 yoF never smoker followed for allergic rhinitis, allergic conjunctivitis, asthma, complicated by HBP. Here with a family member FOLLOWS FOR: still on vaccine and doing well; denies any flare ups with pollen increase at this time. Doing well. Continues allergy vaccine 1:10 GH. Needing only occasional zyrtec this Spring.  10/18/13- 89 yoF never smoker followed for allergic rhinitis, allergic conjunctivitis, asthma, complicated by HBP. Here with son FOLLOWS FOR: still on allergy vaccine 1:10 GH and doing well; runny nose-clear. She feels she has done fairly well over the past 6 months with no acute events We discussed allergy vaccine and options for any histamines. I am recommending she stop allergy vaccine because I think it has done all it can for her at this age  72/30/15- 89 yoF never smoker followed for allergic rhinitis, allergic conjunctivitis, asthma, complicated by HBP. Here with son FOLLOWS FOR:  Allergy Vaccine 1:10 GH doing well,  No concerns today At last visit we had discussed stopping vaccine. She has tended about this. Noting watery rhinorrhea. We had decided to try getting her shots every other week but her son came back as they were leaving to say she wanted to continue each week. We had planned to use up her current vaccine supply then stop. If they have changed their mind about this, we will hear later.  Review of Systems- HPI Constitutional:   No-   weight loss, night sweats, fevers, chills, fatigue, lassitude. HEENT:   No-  headaches, difficulty swallowing, tooth/dental problems, sore throat,       No-  sneezing, itching, ear ache, nasal  congestion, +post nasal drip,  CV:  No-   chest pain, orthopnea, PND, swelling in lower extremities, anasarca, dizziness, palpitations Resp: No-   shortness of breath with exertion or at rest.              No-   productive cough,  No non-productive cough,  No- coughing up of blood.              No-   change in color of mucus.   No- wheezing.   Skin: No-   rash or lesions. GI:  No-   heartburn, indigestion, abdominal pain, nausea, vomiting,  GU: MS:  No-   joint pain or swelling.   Neuro-     nothing unusual Psych:  No- change in mood or affect. No depression or anxiety.  No memory loss.  Objective:   Physical Exam General- Alert, Oriented, Affect-appropriate, Distress- none acute    Small, elderly lady Skin- rash-none, lesions- none, excoriation- none Lymphadenopathy- none Head- atraumatic            Eyes- Gross vision intact, PERRLA,  clear secretions with minor watering, not injected..             Ears- Hearing, canals            Nose- +pale mucosa, watery mucus, No-Septal dev, polyps, erosion, perforation             Throat- Mallampati II , mucosa clear , drainage- none, tonsils- atrophic Neck- flexible , trachea midline, no stridor , thyroid nl, carotid no bruit Chest - symmetrical excursion , unlabored           Heart/CV- RRR , no murmur , no gallop  , no rub, nl s1 s2                           - JVD- none , edema- none, stasis changes- none, varices- none           Lung- clear to P&A, wheeze- none, cough- none , dullness-none, rub- none           Chest wall-  Abd-  Br/ Gen/ Rectal- Not done, not indicated Extrem- cyanosis- none, clubbing, none, atrophy- none, strength- nl Neuro- grossly intact to observation

## 2014-04-18 ENCOUNTER — Ambulatory Visit: Payer: Medicare Other

## 2014-04-18 ENCOUNTER — Encounter: Payer: Self-pay | Admitting: Nurse Practitioner

## 2014-04-18 ENCOUNTER — Ambulatory Visit (INDEPENDENT_AMBULATORY_CARE_PROVIDER_SITE_OTHER): Payer: Medicare Other | Admitting: Nurse Practitioner

## 2014-04-18 VITALS — BP 145/64 | HR 67 | Ht 61.75 in | Wt 128.0 lb

## 2014-04-18 DIAGNOSIS — R413 Other amnesia: Secondary | ICD-10-CM | POA: Insufficient documentation

## 2014-04-18 DIAGNOSIS — F068 Other specified mental disorders due to known physiological condition: Secondary | ICD-10-CM

## 2014-04-18 MED ORDER — DONEPEZIL HCL 10 MG PO TABS: 10.0000 mg | ORAL_TABLET | Freq: Every day | ORAL | Status: DC

## 2014-04-18 NOTE — Assessment & Plan Note (Signed)
She seems ambivalent about stopping allergy shots. I had suggested she use up her current supply then stop. She wants to watch her status through the current pollen season.
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
## 2014-04-18 NOTE — Patient Instructions (Signed)
Continue Aricept at 10mg  daily F/U in 6 months

## 2014-04-18 NOTE — Assessment & Plan Note (Signed)
Well controlled 

## 2014-04-25 ENCOUNTER — Ambulatory Visit (INDEPENDENT_AMBULATORY_CARE_PROVIDER_SITE_OTHER): Payer: Medicare Other

## 2014-04-25 DIAGNOSIS — J309 Allergic rhinitis, unspecified: Secondary | ICD-10-CM

## 2014-05-02 ENCOUNTER — Ambulatory Visit (INDEPENDENT_AMBULATORY_CARE_PROVIDER_SITE_OTHER): Payer: Medicare Other

## 2014-05-02 DIAGNOSIS — J309 Allergic rhinitis, unspecified: Secondary | ICD-10-CM

## 2014-05-09 ENCOUNTER — Ambulatory Visit (INDEPENDENT_AMBULATORY_CARE_PROVIDER_SITE_OTHER): Payer: Medicare Other

## 2014-05-09 DIAGNOSIS — J309 Allergic rhinitis, unspecified: Secondary | ICD-10-CM

## 2014-05-16 ENCOUNTER — Ambulatory Visit (INDEPENDENT_AMBULATORY_CARE_PROVIDER_SITE_OTHER): Payer: Medicare Other

## 2014-05-16 DIAGNOSIS — J309 Allergic rhinitis, unspecified: Secondary | ICD-10-CM

## 2014-05-20 ENCOUNTER — Encounter: Payer: Self-pay | Admitting: Internal Medicine

## 2014-05-23 ENCOUNTER — Ambulatory Visit (INDEPENDENT_AMBULATORY_CARE_PROVIDER_SITE_OTHER): Payer: Medicare Other

## 2014-05-23 DIAGNOSIS — J309 Allergic rhinitis, unspecified: Secondary | ICD-10-CM

## 2014-05-30 ENCOUNTER — Ambulatory Visit (INDEPENDENT_AMBULATORY_CARE_PROVIDER_SITE_OTHER): Payer: Medicare Other

## 2014-05-30 DIAGNOSIS — J309 Allergic rhinitis, unspecified: Secondary | ICD-10-CM

## 2014-06-06 ENCOUNTER — Ambulatory Visit (INDEPENDENT_AMBULATORY_CARE_PROVIDER_SITE_OTHER): Payer: Medicare Other

## 2014-06-06 DIAGNOSIS — J309 Allergic rhinitis, unspecified: Secondary | ICD-10-CM

## 2014-06-13 ENCOUNTER — Ambulatory Visit (INDEPENDENT_AMBULATORY_CARE_PROVIDER_SITE_OTHER): Payer: Medicare Other

## 2014-06-13 DIAGNOSIS — J309 Allergic rhinitis, unspecified: Secondary | ICD-10-CM

## 2014-06-19 ENCOUNTER — Ambulatory Visit (INDEPENDENT_AMBULATORY_CARE_PROVIDER_SITE_OTHER): Payer: Medicare Other

## 2014-06-19 DIAGNOSIS — J309 Allergic rhinitis, unspecified: Secondary | ICD-10-CM

## 2014-06-27 ENCOUNTER — Ambulatory Visit (INDEPENDENT_AMBULATORY_CARE_PROVIDER_SITE_OTHER): Payer: Medicare Other

## 2014-06-27 DIAGNOSIS — J309 Allergic rhinitis, unspecified: Secondary | ICD-10-CM

## 2014-07-04 ENCOUNTER — Ambulatory Visit (INDEPENDENT_AMBULATORY_CARE_PROVIDER_SITE_OTHER): Payer: Medicare Other

## 2014-07-04 DIAGNOSIS — J309 Allergic rhinitis, unspecified: Secondary | ICD-10-CM

## 2014-07-11 ENCOUNTER — Ambulatory Visit (INDEPENDENT_AMBULATORY_CARE_PROVIDER_SITE_OTHER): Payer: Medicare Other

## 2014-07-11 DIAGNOSIS — J309 Allergic rhinitis, unspecified: Secondary | ICD-10-CM

## 2014-07-18 ENCOUNTER — Ambulatory Visit (INDEPENDENT_AMBULATORY_CARE_PROVIDER_SITE_OTHER): Payer: Medicare Other

## 2014-07-18 DIAGNOSIS — J309 Allergic rhinitis, unspecified: Secondary | ICD-10-CM

## 2014-07-25 ENCOUNTER — Ambulatory Visit (INDEPENDENT_AMBULATORY_CARE_PROVIDER_SITE_OTHER): Payer: Medicare Other

## 2014-07-25 DIAGNOSIS — J309 Allergic rhinitis, unspecified: Secondary | ICD-10-CM

## 2014-08-01 ENCOUNTER — Ambulatory Visit (INDEPENDENT_AMBULATORY_CARE_PROVIDER_SITE_OTHER): Payer: Medicare Other

## 2014-08-01 DIAGNOSIS — J309 Allergic rhinitis, unspecified: Secondary | ICD-10-CM

## 2014-08-08 ENCOUNTER — Ambulatory Visit (INDEPENDENT_AMBULATORY_CARE_PROVIDER_SITE_OTHER): Payer: Medicare Other

## 2014-08-08 DIAGNOSIS — J309 Allergic rhinitis, unspecified: Secondary | ICD-10-CM

## 2014-08-13 ENCOUNTER — Ambulatory Visit (INDEPENDENT_AMBULATORY_CARE_PROVIDER_SITE_OTHER): Payer: Medicare Other

## 2014-08-13 DIAGNOSIS — J309 Allergic rhinitis, unspecified: Secondary | ICD-10-CM

## 2014-08-15 ENCOUNTER — Ambulatory Visit (INDEPENDENT_AMBULATORY_CARE_PROVIDER_SITE_OTHER): Payer: Medicare Other

## 2014-08-15 DIAGNOSIS — J309 Allergic rhinitis, unspecified: Secondary | ICD-10-CM

## 2014-08-20 ENCOUNTER — Encounter: Payer: Self-pay | Admitting: Internal Medicine

## 2014-08-22 ENCOUNTER — Ambulatory Visit (INDEPENDENT_AMBULATORY_CARE_PROVIDER_SITE_OTHER): Payer: Medicare Other

## 2014-08-22 ENCOUNTER — Encounter: Payer: Self-pay | Admitting: *Deleted

## 2014-08-22 DIAGNOSIS — J309 Allergic rhinitis, unspecified: Secondary | ICD-10-CM

## 2014-08-29 ENCOUNTER — Ambulatory Visit (INDEPENDENT_AMBULATORY_CARE_PROVIDER_SITE_OTHER): Payer: Medicare Other

## 2014-08-29 DIAGNOSIS — J309 Allergic rhinitis, unspecified: Secondary | ICD-10-CM

## 2014-09-05 ENCOUNTER — Ambulatory Visit (INDEPENDENT_AMBULATORY_CARE_PROVIDER_SITE_OTHER): Payer: Medicare Other

## 2014-09-05 DIAGNOSIS — J309 Allergic rhinitis, unspecified: Secondary | ICD-10-CM

## 2014-09-12 ENCOUNTER — Ambulatory Visit (INDEPENDENT_AMBULATORY_CARE_PROVIDER_SITE_OTHER): Payer: Medicare Other

## 2014-09-12 DIAGNOSIS — J309 Allergic rhinitis, unspecified: Secondary | ICD-10-CM

## 2014-09-19 ENCOUNTER — Ambulatory Visit (INDEPENDENT_AMBULATORY_CARE_PROVIDER_SITE_OTHER): Payer: Medicare Other

## 2014-09-19 DIAGNOSIS — J309 Allergic rhinitis, unspecified: Secondary | ICD-10-CM

## 2014-09-26 ENCOUNTER — Ambulatory Visit (INDEPENDENT_AMBULATORY_CARE_PROVIDER_SITE_OTHER): Payer: Medicare Other

## 2014-09-26 DIAGNOSIS — J309 Allergic rhinitis, unspecified: Secondary | ICD-10-CM

## 2014-10-03 ENCOUNTER — Ambulatory Visit (INDEPENDENT_AMBULATORY_CARE_PROVIDER_SITE_OTHER): Payer: Medicare Other

## 2014-10-03 DIAGNOSIS — J309 Allergic rhinitis, unspecified: Secondary | ICD-10-CM

## 2014-10-10 ENCOUNTER — Ambulatory Visit (INDEPENDENT_AMBULATORY_CARE_PROVIDER_SITE_OTHER): Payer: Medicare Other

## 2014-10-10 DIAGNOSIS — J309 Allergic rhinitis, unspecified: Secondary | ICD-10-CM

## 2014-10-17 ENCOUNTER — Ambulatory Visit (INDEPENDENT_AMBULATORY_CARE_PROVIDER_SITE_OTHER): Payer: Medicare Other | Admitting: Internal Medicine

## 2014-10-17 ENCOUNTER — Ambulatory Visit (INDEPENDENT_AMBULATORY_CARE_PROVIDER_SITE_OTHER): Payer: Medicare Other

## 2014-10-17 ENCOUNTER — Encounter: Payer: Self-pay | Admitting: Internal Medicine

## 2014-10-17 VITALS — BP 136/60 | HR 64 | Ht 60.0 in | Wt 121.6 lb

## 2014-10-17 DIAGNOSIS — J301 Allergic rhinitis due to pollen: Secondary | ICD-10-CM

## 2014-10-17 DIAGNOSIS — J309 Allergic rhinitis, unspecified: Secondary | ICD-10-CM

## 2014-10-17 DIAGNOSIS — H1045 Other chronic allergic conjunctivitis: Secondary | ICD-10-CM

## 2014-10-17 NOTE — Progress Notes (Signed)
Patient ID: Jeanette Montoya, female    DOB: 1922-07-21, 80 y.o.   MRN: [id number]  HPI 07/12/11- [age] yoF never smoker followed for allergic rhinitis, allergic conjunctivitis, asthma, complicated by HBP. Last here February 03, 2011- note reviewed In the past week she has been having episodes of shortness of breath, hard to get satisfying breath, feeling somehow associated with right side of neck.. Maybe some cough, very scant mucus. Denies pain, palpitation, syncope. Proair didn't help. Today feels well. Can't identify an associated trigger, relief or situation. Spells last a few minutes, self limited. Denies swelling or aching in legs.  Still bothered by chronic mattering from eyes. The allergy shots help and she is choosing to continue. 1:10 GH  01/10/12- 88 yoF never smoker followed for allergic rhinitis, allergic conjunctivitis, asthma, complicated by HBP. She denies shortness of breath at this time has felt fairly stable with no cough. She still complains of thickened mucoid drainage from her eyes with little conjunctival burning or itching. She is treated for glaucoma.  07/09/12- 88 yoF never smoker followed for allergic rhinitis, allergic conjunctivitis, asthma, complicated by HBP. Denies any cough or sob at this time  Allergy problems were "not too bad" this spring but humidity is bothering her now. She says her allergy shots are "good for me". She has had a couple of lightheaded or fainting episodes with no injury. She can't describe them very well but they don't seem related to her breathing.  01/11/13  88 yoF never smoker followed for allergic rhinitis, allergic conjunctivitis, asthma, complicated by HBP. Here with a family member FOLLOWS FOR: still on vaccine 1:10 GH and doing well; denies any flare ups or reactions at this time. Has not wheezed in a long time and no longer has rescue inhaler. For surgery on tear duct left eye, which may help her chronic complaint of "matter" from her  eye.  04/12/13- 88 yoF never smoker followed for allergic rhinitis, allergic conjunctivitis, asthma, complicated by HBP. Here with a family member FOLLOWS FOR: still on vaccine and doing well; denies any flare ups with pollen increase at this time. Doing well. Continues allergy vaccine 1:10 GH. Needing only occasional zyrtec this Spring.  10/18/13- 89 yoF never smoker followed for allergic rhinitis, allergic conjunctivitis, asthma, complicated by HBP. Here with son FOLLOWS FOR: still on allergy vaccine 1:10 GH and doing well; runny nose-clear. She feels she has done fairly well over the past 6 months with no acute events We discussed allergy vaccine and options for any histamines. I am recommending she stop allergy vaccine because I think it has done all it can for her at this age  65/30/15- 89 yoF never smoker followed for allergic rhinitis, allergic conjunctivitis, asthma, complicated by HBP. Here with son FOLLOWS FOR:  Allergy Vaccine 1:10 GH doing well,  No concerns today At last visit we had discussed stopping vaccine. She has tended about this. Noting watery rhinorrhea. We had decided to try getting her shots every other week but her son came back as they were leaving to say she wanted to continue each week. We had planned to use up her current vaccine supply then stop. If they have changed their mind about this, we will hear later.  10/17/14- 89 yoF never smoker followed for allergic rhinitis, allergic conjunctivitis, asthma, complicated by HBP. Here with son FOLLOWS FOR:  Allergy vaccine 1:10 GH doing well for the most part. Patient complains of let eye watering. Overflow lacrimation has been a chronic  issue for her. We discussed her glaucoma affecting medication choices.  Review of Systems- HPI Constitutional:   No-   weight loss, night sweats, fevers, chills, fatigue, lassitude. HEENT:   No-  headaches, difficulty swallowing, tooth/dental problems, sore throat,       No-  sneezing,  itching, ear ache, nasal congestion, +post nasal drip,  CV:  No-   chest pain, orthopnea, PND, swelling in lower extremities, anasarca, dizziness, palpitations Resp: No-   shortness of breath with exertion or at rest.              No-   productive cough,  No non-productive cough,  No- coughing up of blood.              No-   change in color of mucus.  No- wheezing.   Skin: No-   rash or lesions. GI:  No-   heartburn, indigestion, abdominal pain, nausea, vomiting,  GU: MS:  No-   joint pain or swelling.   Neuro-     nothing unusual Psych:  No- change in mood or affect. No depression or anxiety.  No memory loss.  Objective:   Physical Exam General- Alert, Oriented, Affect-appropriate, Distress- none acute    Small, elderly lady Skin- rash-none, lesions- none, excoriation- none Lymphadenopathy- none Head- atraumatic            Eyes- Gross vision intact, PERRLA,  clear secretions with minor watering, not injected..             Ears- Hearing, canals grossly normal            Nose- +pale mucosa, watery mucus, No-Septal dev, polyps, erosion, perforation             Throat- Mallampati II , mucosa clear , drainage- none, tonsils- atrophic Neck- flexible , trachea midline, no stridor , thyroid nl, carotid no bruit Chest - symmetrical excursion , unlabored           Heart/CV- RRR , 1/6 syst murmur LUSB , no gallop  , no rub, nl s1 s2                           - JVD- none , edema- none, stasis changes- none, varices- none           Lung- clear to P&A, wheeze- none, cough- none , dullness-none, rub- none           Chest wall-  Abd-  Br/ Gen/ Rectal- Not done, not indicated Extrem- cyanosis- none, clubbing, none, atrophy- none, strength- nl Neuro- grossly intact to observation

## 2014-10-17 NOTE — Patient Instructions (Signed)
Ok to continue allergy vaccine 1:10 GH  Please call as needed

## 2014-10-18 NOTE — Assessment & Plan Note (Signed)
She has tried most available tools from allergy and ophthalmology providers. She may need to put up with her watery eye. She has chosen to continue allergy vaccine. We will discuss this again on return.

## 2014-10-18 NOTE — Assessment & Plan Note (Signed)
Allergic and probably vasomotor rhinitis, generally well controlled Plan-she wants to continue allergy vaccines so we will make no change for now

## 2014-10-22 ENCOUNTER — Encounter: Payer: Self-pay | Admitting: Neurology

## 2014-10-22 ENCOUNTER — Ambulatory Visit (INDEPENDENT_AMBULATORY_CARE_PROVIDER_SITE_OTHER): Payer: Medicare Other | Admitting: Neurology

## 2014-10-22 VITALS — BP 126/63 | HR 59 | Ht 61.0 in | Wt 122.0 lb

## 2014-10-22 DIAGNOSIS — F99 Mental disorder, not otherwise specified: Secondary | ICD-10-CM

## 2014-10-22 MED ORDER — DONEPEZIL HCL 10 MG PO TABS: 10.0000 mg | ORAL_TABLET | Freq: Every day | ORAL | Status: DC

## 2014-10-22 MED ORDER — MEMANTINE HCL 28 X 5 MG & 21 X 10 MG PO TABS: ORAL_TABLET | ORAL | Status: DC

## 2014-10-22 MED ORDER — MEMANTINE HCL ER 28 MG PO CP24: 28.0000 mg | ORAL_CAPSULE | Freq: Every day | ORAL | Status: DC
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
## 2014-10-24 ENCOUNTER — Ambulatory Visit (INDEPENDENT_AMBULATORY_CARE_PROVIDER_SITE_OTHER): Payer: Medicare Other

## 2014-10-24 DIAGNOSIS — J309 Allergic rhinitis, unspecified: Secondary | ICD-10-CM

## 2014-10-31 ENCOUNTER — Ambulatory Visit (INDEPENDENT_AMBULATORY_CARE_PROVIDER_SITE_OTHER): Payer: Medicare Other

## 2014-10-31 ENCOUNTER — Encounter: Payer: Self-pay | Admitting: Internal Medicine

## 2014-10-31 DIAGNOSIS — J309 Allergic rhinitis, unspecified: Secondary | ICD-10-CM

## 2014-11-05 ENCOUNTER — Encounter: Payer: Self-pay | Admitting: Neurology

## 2014-11-07 ENCOUNTER — Ambulatory Visit (INDEPENDENT_AMBULATORY_CARE_PROVIDER_SITE_OTHER): Payer: Medicare Other

## 2014-11-07 DIAGNOSIS — J309 Allergic rhinitis, unspecified: Secondary | ICD-10-CM

## 2014-11-11 ENCOUNTER — Encounter: Payer: Self-pay | Admitting: Neurology

## 2014-11-14 ENCOUNTER — Ambulatory Visit: Payer: Medicare Other

## 2014-11-21 ENCOUNTER — Ambulatory Visit (INDEPENDENT_AMBULATORY_CARE_PROVIDER_SITE_OTHER): Payer: Medicare Other

## 2014-11-21 DIAGNOSIS — J309 Allergic rhinitis, unspecified: Secondary | ICD-10-CM

## 2014-11-28 ENCOUNTER — Ambulatory Visit (INDEPENDENT_AMBULATORY_CARE_PROVIDER_SITE_OTHER): Payer: Medicare Other

## 2014-11-28 DIAGNOSIS — J309 Allergic rhinitis, unspecified: Secondary | ICD-10-CM

## 2014-12-05 ENCOUNTER — Ambulatory Visit (INDEPENDENT_AMBULATORY_CARE_PROVIDER_SITE_OTHER): Payer: Medicare Other

## 2014-12-05 DIAGNOSIS — J309 Allergic rhinitis, unspecified: Secondary | ICD-10-CM

## 2014-12-11 ENCOUNTER — Ambulatory Visit (INDEPENDENT_AMBULATORY_CARE_PROVIDER_SITE_OTHER): Payer: Medicare Other

## 2014-12-11 DIAGNOSIS — J309 Allergic rhinitis, unspecified: Secondary | ICD-10-CM

## 2014-12-15 ENCOUNTER — Telehealth: Payer: Self-pay | Admitting: Neurology

## 2014-12-15 NOTE — Telephone Encounter (Signed)
Patient's son Jeanette Montoya has questions about his mother's medication and dosages for Jeanette Montoya. She is having balance issues with the current dosage. Please call Jeanette Montoya back at [phone].

## 2014-12-16 ENCOUNTER — Ambulatory Visit (INDEPENDENT_AMBULATORY_CARE_PROVIDER_SITE_OTHER): Payer: Medicare Other

## 2014-12-16 DIAGNOSIS — J309 Allergic rhinitis, unspecified: Secondary | ICD-10-CM

## 2014-12-16 NOTE — Telephone Encounter (Signed)
Called Jeanette Montoya back at the number he requested to be called back on . Jeanette Montoya was not home I will call Jeanette Montoya back before I leave the office today.

## 2014-12-17 MED ORDER — MEMANTINE HCL 10 MG PO TABS: 10.0000 mg | ORAL_TABLET | Freq: Two times a day (BID) | ORAL | Status: DC

## 2014-12-17 NOTE — Telephone Encounter (Signed)
Called and spoke to Grae[place]George he states the [place] may be making her dizzy. Patient talked to the Pharmacist and she told him to give her a little later in the day. Patient's son  wants to know if Dr.Yan can do anything about the dizziness.

## 2014-12-17 NOTE — Telephone Encounter (Signed)
I have called her son, I have started Namenda titrating package since her last visit in November 2015, while she was taking lower dosage up to 18 mg daily, she was doing well, but since she started taking Namenda xr 28mg  in December 14 2014, she been complains of dizziness,  I have suggested her to change to Namenda 10 mg twice a day new prescription was called in

## 2014-12-18 ENCOUNTER — Ambulatory Visit (INDEPENDENT_AMBULATORY_CARE_PROVIDER_SITE_OTHER): Payer: Medicare Other

## 2014-12-18 DIAGNOSIS — J309 Allergic rhinitis, unspecified: Secondary | ICD-10-CM

## 2014-12-24 ENCOUNTER — Encounter: Payer: Self-pay | Admitting: Internal Medicine

## 2014-12-26 ENCOUNTER — Ambulatory Visit (INDEPENDENT_AMBULATORY_CARE_PROVIDER_SITE_OTHER): Payer: Medicare Other

## 2014-12-26 DIAGNOSIS — J309 Allergic rhinitis, unspecified: Secondary | ICD-10-CM

## 2015-01-02 ENCOUNTER — Ambulatory Visit (INDEPENDENT_AMBULATORY_CARE_PROVIDER_SITE_OTHER): Payer: Medicare Other

## 2015-01-02 DIAGNOSIS — J309 Allergic rhinitis, unspecified: Secondary | ICD-10-CM

## 2015-01-09 ENCOUNTER — Ambulatory Visit: Payer: Medicare Other

## 2015-01-16 ENCOUNTER — Ambulatory Visit (INDEPENDENT_AMBULATORY_CARE_PROVIDER_SITE_OTHER): Payer: Medicare Other

## 2015-01-16 DIAGNOSIS — J309 Allergic rhinitis, unspecified: Secondary | ICD-10-CM

## 2015-01-23 ENCOUNTER — Ambulatory Visit (INDEPENDENT_AMBULATORY_CARE_PROVIDER_SITE_OTHER): Payer: Medicare Other

## 2015-01-23 DIAGNOSIS — J309 Allergic rhinitis, unspecified: Secondary | ICD-10-CM

## 2015-01-30 ENCOUNTER — Ambulatory Visit (INDEPENDENT_AMBULATORY_CARE_PROVIDER_SITE_OTHER): Payer: Medicare Other

## 2015-01-30 DIAGNOSIS — J309 Allergic rhinitis, unspecified: Secondary | ICD-10-CM

## 2015-02-06 ENCOUNTER — Ambulatory Visit (INDEPENDENT_AMBULATORY_CARE_PROVIDER_SITE_OTHER): Payer: Medicare Other

## 2015-02-06 DIAGNOSIS — J309 Allergic rhinitis, unspecified: Secondary | ICD-10-CM

## 2015-02-13 ENCOUNTER — Ambulatory Visit (INDEPENDENT_AMBULATORY_CARE_PROVIDER_SITE_OTHER): Payer: Medicare Other

## 2015-02-13 DIAGNOSIS — J309 Allergic rhinitis, unspecified: Secondary | ICD-10-CM

## 2015-02-20 ENCOUNTER — Ambulatory Visit (INDEPENDENT_AMBULATORY_CARE_PROVIDER_SITE_OTHER): Payer: Medicare Other

## 2015-02-20 DIAGNOSIS — J309 Allergic rhinitis, unspecified: Secondary | ICD-10-CM

## 2015-02-27 ENCOUNTER — Encounter: Payer: Self-pay | Admitting: Internal Medicine

## 2015-02-27 ENCOUNTER — Ambulatory Visit (INDEPENDENT_AMBULATORY_CARE_PROVIDER_SITE_OTHER): Payer: Medicare Other

## 2015-02-27 DIAGNOSIS — J309 Allergic rhinitis, unspecified: Secondary | ICD-10-CM

## 2015-03-06 ENCOUNTER — Ambulatory Visit (INDEPENDENT_AMBULATORY_CARE_PROVIDER_SITE_OTHER): Payer: Medicare Other

## 2015-03-06 DIAGNOSIS — J309 Allergic rhinitis, unspecified: Secondary | ICD-10-CM

## 2015-03-12 ENCOUNTER — Ambulatory Visit (INDEPENDENT_AMBULATORY_CARE_PROVIDER_SITE_OTHER): Payer: Medicare Other

## 2015-03-12 DIAGNOSIS — J309 Allergic rhinitis, unspecified: Secondary | ICD-10-CM | POA: Diagnosis not present

## 2015-03-20 ENCOUNTER — Ambulatory Visit (INDEPENDENT_AMBULATORY_CARE_PROVIDER_SITE_OTHER): Payer: Medicare Other

## 2015-03-20 DIAGNOSIS — J309 Allergic rhinitis, unspecified: Secondary | ICD-10-CM

## 2015-03-27 ENCOUNTER — Ambulatory Visit (INDEPENDENT_AMBULATORY_CARE_PROVIDER_SITE_OTHER): Payer: Medicare Other

## 2015-03-27 DIAGNOSIS — J309 Allergic rhinitis, unspecified: Secondary | ICD-10-CM

## 2015-04-03 ENCOUNTER — Ambulatory Visit (INDEPENDENT_AMBULATORY_CARE_PROVIDER_SITE_OTHER): Payer: Medicare Other

## 2015-04-03 DIAGNOSIS — J309 Allergic rhinitis, unspecified: Secondary | ICD-10-CM | POA: Diagnosis not present

## 2015-04-10 ENCOUNTER — Ambulatory Visit (INDEPENDENT_AMBULATORY_CARE_PROVIDER_SITE_OTHER): Payer: Medicare Other

## 2015-04-10 DIAGNOSIS — J309 Allergic rhinitis, unspecified: Secondary | ICD-10-CM | POA: Diagnosis not present

## 2015-04-17 ENCOUNTER — Ambulatory Visit (INDEPENDENT_AMBULATORY_CARE_PROVIDER_SITE_OTHER): Payer: Medicare Other

## 2015-04-17 ENCOUNTER — Encounter: Payer: Self-pay | Admitting: Internal Medicine

## 2015-04-17 ENCOUNTER — Ambulatory Visit (INDEPENDENT_AMBULATORY_CARE_PROVIDER_SITE_OTHER): Payer: Medicare Other | Admitting: Internal Medicine

## 2015-04-17 VITALS — BP 112/60 | HR 56 | Ht 60.0 in | Wt 115.0 lb

## 2015-04-17 DIAGNOSIS — J301 Allergic rhinitis due to pollen: Secondary | ICD-10-CM

## 2015-04-17 DIAGNOSIS — J309 Allergic rhinitis, unspecified: Secondary | ICD-10-CM

## 2015-04-17 NOTE — Progress Notes (Signed)
Patient ID: Jeanette Montoya, female    DOB: 1922-07-21, 81 y.o.   MRN: [id number]  HPI 07/12/11- [age] yoF never smoker followed for allergic rhinitis, allergic conjunctivitis, asthma, complicated by HBP. Last here February 03, 2011- note reviewed In the past week she has been having episodes of shortness of breath, hard to get satisfying breath, feeling somehow associated with right side of neck.. Maybe some cough, very scant mucus. Denies pain, palpitation, syncope. Proair didn't help. Today feels well. Can't identify an associated trigger, relief or situation. Spells last a few minutes, self limited. Denies swelling or aching in legs.  Still bothered by chronic mattering from eyes. The allergy shots help and she is choosing to continue. 1:10 GH  01/10/12- 88 yoF never smoker followed for allergic rhinitis, allergic conjunctivitis, asthma, complicated by HBP. She denies shortness of breath at this time has felt fairly stable with no cough. She still complains of thickened mucoid drainage from her eyes with little conjunctival burning or itching. She is treated for glaucoma.  07/09/12- 88 yoF never smoker followed for allergic rhinitis, allergic conjunctivitis, asthma, complicated by HBP. Denies any cough or sob at this time  Allergy problems were "not too bad" this spring but humidity is bothering her now. She says her allergy shots are "good for me". She has had a couple of lightheaded or fainting episodes with no injury. She can't describe them very well but they don't seem related to her breathing.  01/11/13  88 yoF never smoker followed for allergic rhinitis, allergic conjunctivitis, asthma, complicated by HBP. Here with a family member FOLLOWS FOR: still on vaccine 1:10 GH and doing well; denies any flare ups or reactions at this time. Has not wheezed in a long time and no longer has rescue inhaler. For surgery on tear duct left eye, which may help her chronic complaint of "matter" from her  eye.  04/12/13- 88 yoF never smoker followed for allergic rhinitis, allergic conjunctivitis, asthma, complicated by HBP. Here with a family member FOLLOWS FOR: still on vaccine and doing well; denies any flare ups with pollen increase at this time. Doing well. Continues allergy vaccine 1:10 GH. Needing only occasional zyrtec this Spring.  10/18/13- 89 yoF never smoker followed for allergic rhinitis, allergic conjunctivitis, asthma, complicated by HBP. Here with son FOLLOWS FOR: still on allergy vaccine 1:10 GH and doing well; runny nose-clear. She feels she has done fairly well over the past 6 months with no acute events We discussed allergy vaccine and options for any histamines. I am recommending she stop allergy vaccine because I think it has done all it can for her at this age  65/30/15- 89 yoF never smoker followed for allergic rhinitis, allergic conjunctivitis, asthma, complicated by HBP. Here with son FOLLOWS FOR:  Allergy Vaccine 1:10 GH doing well,  No concerns today At last visit we had discussed stopping vaccine. She has tended about this. Noting watery rhinorrhea. We had decided to try getting her shots every other week but her son came back as they were leaving to say she wanted to continue each week. We had planned to use up her current vaccine supply then stop. If they have changed their mind about this, we will hear later.  10/17/14- 89 yoF never smoker followed for allergic rhinitis, allergic conjunctivitis, asthma, complicated by HBP. Here with son FOLLOWS FOR:  Allergy vaccine 1:10 GH doing well for the most part. Patient complains of let eye watering. Overflow lacrimation has been a chronic  issue for her. We discussed her glaucoma affecting medication choices.  04/17/15- 89 yoF never smoker followed for allergic rhinitis, allergic conjunctivitis, asthma, complicated by HBP, glaucoma. Here with son FOLLOWS FOR: still on  Allergy vaccine 1:10 GH and doing well; denies any  itchy,watery eyes, sneezing,etc.   Review of Systems- HPI Constitutional:   No-   weight loss, night sweats, fevers, chills, fatigue, lassitude. HEENT:   No-  headaches, difficulty swallowing, tooth/dental problems, sore throat,       No-  sneezing, itching, ear ache, nasal congestion, +post nasal drip,  CV:  No-   chest pain, orthopnea, PND, swelling in lower extremities, anasarca, dizziness, palpitations Resp: No-   shortness of breath with exertion or at rest.              No-   productive cough,  No non-productive cough,  No- coughing up of blood.              No-   change in color of mucus.  No- wheezing.   Skin: No-   rash or lesions. GI:  No-   heartburn, indigestion, abdominal pain, nausea, vomiting,  GU: MS:  No-   joint pain or swelling.   Neuro-     nothing unusual Psych:  No- change in mood or affect. No depression or anxiety.  No memory loss.  Objective:   Physical Exam General- Alert, Oriented, Affect-appropriate, Distress- none acute    Small, elderly lady Skin- rash-none, lesions- none, excoriation- none Lymphadenopathy- none Head- atraumatic            Eyes- Gross vision intact, PERRLA,  clear secretions with minor watering, not injected..             Ears- Hearing, canals grossly normal            Nose- +pale mucosa, watery mucus, No-Septal dev, polyps, erosion, perforation             Throat- Mallampati II , mucosa clear , drainage- none, tonsils- atrophic Neck- flexible , trachea midline, no stridor , thyroid nl, carotid no bruit Chest - symmetrical excursion , unlabored           Heart/CV- RRR , 1/6 syst murmur LUSB , no gallop  , no rub, nl s1 s2                           - JVD- none , edema- none, stasis changes- none, varices- none           Lung- clear to P&A, wheeze- none, cough- none , dullness-none, rub- none           Chest wall-  Abd-  Br/ Gen/ Rectal- Not done, not indicated Extrem- cyanosis- none, clubbing, none, atrophy- none, strength- nl Neuro-  grossly intact to observation

## 2015-04-17 NOTE — Patient Instructions (Signed)
We czn continue allergy vaccine 1:10 GH  Please call if needed

## 2015-04-24 ENCOUNTER — Ambulatory Visit (INDEPENDENT_AMBULATORY_CARE_PROVIDER_SITE_OTHER): Payer: Medicare Other

## 2015-04-24 DIAGNOSIS — J309 Allergic rhinitis, unspecified: Secondary | ICD-10-CM | POA: Diagnosis not present

## 2015-04-27 ENCOUNTER — Ambulatory Visit (INDEPENDENT_AMBULATORY_CARE_PROVIDER_SITE_OTHER): Payer: Medicare Other | Admitting: Neurology

## 2015-04-27 ENCOUNTER — Encounter: Payer: Self-pay | Admitting: Neurology

## 2015-04-27 VITALS — BP 136/64 | HR 64 | Ht 60.0 in | Wt 116.0 lb

## 2015-04-27 DIAGNOSIS — R413 Other amnesia: Secondary | ICD-10-CM | POA: Diagnosis not present

## 2015-04-27 DIAGNOSIS — F0390 Unspecified dementia without behavioral disturbance: Secondary | ICD-10-CM | POA: Diagnosis not present

## 2015-04-27 MED ORDER — DONEPEZIL HCL 10 MG PO TABS: 10.0000 mg | ORAL_TABLET | Freq: Every day | ORAL | Status: DC

## 2015-04-27 MED ORDER — MEMANTINE HCL 10 MG PO TABS: 10.0000 mg | ORAL_TABLET | Freq: Two times a day (BID) | ORAL | Status: DC
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
## 2015-05-01 ENCOUNTER — Ambulatory Visit (INDEPENDENT_AMBULATORY_CARE_PROVIDER_SITE_OTHER): Payer: Medicare Other

## 2015-05-01 DIAGNOSIS — J309 Allergic rhinitis, unspecified: Secondary | ICD-10-CM | POA: Diagnosis not present

## 2015-05-08 ENCOUNTER — Ambulatory Visit (INDEPENDENT_AMBULATORY_CARE_PROVIDER_SITE_OTHER): Payer: Medicare Other

## 2015-05-08 DIAGNOSIS — J309 Allergic rhinitis, unspecified: Secondary | ICD-10-CM | POA: Diagnosis not present

## 2015-05-15 ENCOUNTER — Ambulatory Visit (INDEPENDENT_AMBULATORY_CARE_PROVIDER_SITE_OTHER): Payer: Medicare Other

## 2015-05-15 DIAGNOSIS — J309 Allergic rhinitis, unspecified: Secondary | ICD-10-CM

## 2015-05-22 ENCOUNTER — Ambulatory Visit (INDEPENDENT_AMBULATORY_CARE_PROVIDER_SITE_OTHER): Payer: Medicare Other

## 2015-05-22 DIAGNOSIS — J309 Allergic rhinitis, unspecified: Secondary | ICD-10-CM | POA: Diagnosis not present

## 2015-05-28 ENCOUNTER — Encounter: Payer: Self-pay | Admitting: Internal Medicine

## 2015-05-29 ENCOUNTER — Ambulatory Visit (INDEPENDENT_AMBULATORY_CARE_PROVIDER_SITE_OTHER): Payer: Medicare Other

## 2015-05-29 DIAGNOSIS — J309 Allergic rhinitis, unspecified: Secondary | ICD-10-CM

## 2015-06-05 ENCOUNTER — Ambulatory Visit (INDEPENDENT_AMBULATORY_CARE_PROVIDER_SITE_OTHER): Payer: Medicare Other

## 2015-06-05 DIAGNOSIS — J309 Allergic rhinitis, unspecified: Secondary | ICD-10-CM | POA: Diagnosis not present

## 2015-06-12 ENCOUNTER — Ambulatory Visit (INDEPENDENT_AMBULATORY_CARE_PROVIDER_SITE_OTHER): Payer: Medicare Other

## 2015-06-12 DIAGNOSIS — J309 Allergic rhinitis, unspecified: Secondary | ICD-10-CM | POA: Diagnosis not present

## 2015-06-19 ENCOUNTER — Ambulatory Visit (INDEPENDENT_AMBULATORY_CARE_PROVIDER_SITE_OTHER): Payer: Medicare Other

## 2015-06-19 DIAGNOSIS — J309 Allergic rhinitis, unspecified: Secondary | ICD-10-CM | POA: Diagnosis not present

## 2015-06-26 ENCOUNTER — Ambulatory Visit (INDEPENDENT_AMBULATORY_CARE_PROVIDER_SITE_OTHER): Payer: Medicare Other

## 2015-06-26 DIAGNOSIS — J309 Allergic rhinitis, unspecified: Secondary | ICD-10-CM | POA: Diagnosis not present

## 2015-07-03 ENCOUNTER — Ambulatory Visit (INDEPENDENT_AMBULATORY_CARE_PROVIDER_SITE_OTHER): Payer: Medicare Other

## 2015-07-03 DIAGNOSIS — J309 Allergic rhinitis, unspecified: Secondary | ICD-10-CM

## 2015-07-10 ENCOUNTER — Ambulatory Visit: Payer: Medicare Other

## 2015-07-17 ENCOUNTER — Ambulatory Visit (INDEPENDENT_AMBULATORY_CARE_PROVIDER_SITE_OTHER): Payer: Medicare Other

## 2015-07-17 DIAGNOSIS — J309 Allergic rhinitis, unspecified: Secondary | ICD-10-CM

## 2015-07-24 ENCOUNTER — Ambulatory Visit (INDEPENDENT_AMBULATORY_CARE_PROVIDER_SITE_OTHER): Payer: Medicare Other

## 2015-07-24 DIAGNOSIS — J309 Allergic rhinitis, unspecified: Secondary | ICD-10-CM

## 2015-07-31 ENCOUNTER — Ambulatory Visit (INDEPENDENT_AMBULATORY_CARE_PROVIDER_SITE_OTHER): Payer: Medicare Other

## 2015-07-31 DIAGNOSIS — J309 Allergic rhinitis, unspecified: Secondary | ICD-10-CM

## 2015-08-07 ENCOUNTER — Ambulatory Visit (INDEPENDENT_AMBULATORY_CARE_PROVIDER_SITE_OTHER): Payer: Medicare Other

## 2015-08-07 DIAGNOSIS — J309 Allergic rhinitis, unspecified: Secondary | ICD-10-CM

## 2015-08-14 ENCOUNTER — Ambulatory Visit (INDEPENDENT_AMBULATORY_CARE_PROVIDER_SITE_OTHER): Payer: Medicare Other

## 2015-08-14 DIAGNOSIS — J309 Allergic rhinitis, unspecified: Secondary | ICD-10-CM | POA: Diagnosis not present

## 2015-08-21 ENCOUNTER — Ambulatory Visit (INDEPENDENT_AMBULATORY_CARE_PROVIDER_SITE_OTHER): Payer: Medicare Other

## 2015-08-21 DIAGNOSIS — J309 Allergic rhinitis, unspecified: Secondary | ICD-10-CM | POA: Diagnosis not present

## 2015-08-26 ENCOUNTER — Ambulatory Visit (INDEPENDENT_AMBULATORY_CARE_PROVIDER_SITE_OTHER): Payer: Medicare Other

## 2015-08-26 ENCOUNTER — Telehealth: Payer: Self-pay | Admitting: Internal Medicine

## 2015-08-26 DIAGNOSIS — J309 Allergic rhinitis, unspecified: Secondary | ICD-10-CM

## 2015-08-26 NOTE — Telephone Encounter (Signed)
Date Mixed: 08/26/15 Vial: 2 Strength: 1:10 Here/Mail/Pick Up: here Mixed By: tbs 

## 2015-08-28 ENCOUNTER — Ambulatory Visit (INDEPENDENT_AMBULATORY_CARE_PROVIDER_SITE_OTHER): Payer: Medicare Other

## 2015-08-28 DIAGNOSIS — J309 Allergic rhinitis, unspecified: Secondary | ICD-10-CM | POA: Diagnosis not present

## 2015-09-04 ENCOUNTER — Ambulatory Visit (INDEPENDENT_AMBULATORY_CARE_PROVIDER_SITE_OTHER): Payer: Medicare Other

## 2015-09-04 DIAGNOSIS — J309 Allergic rhinitis, unspecified: Secondary | ICD-10-CM

## 2015-09-11 ENCOUNTER — Ambulatory Visit (INDEPENDENT_AMBULATORY_CARE_PROVIDER_SITE_OTHER): Payer: Medicare Other

## 2015-09-11 DIAGNOSIS — J309 Allergic rhinitis, unspecified: Secondary | ICD-10-CM

## 2015-09-21 ENCOUNTER — Encounter: Payer: Self-pay | Admitting: Internal Medicine

## 2015-09-25 ENCOUNTER — Ambulatory Visit (INDEPENDENT_AMBULATORY_CARE_PROVIDER_SITE_OTHER): Payer: Medicare Other

## 2015-09-25 DIAGNOSIS — J309 Allergic rhinitis, unspecified: Secondary | ICD-10-CM | POA: Diagnosis not present

## 2015-10-02 ENCOUNTER — Ambulatory Visit (INDEPENDENT_AMBULATORY_CARE_PROVIDER_SITE_OTHER): Payer: Medicare Other

## 2015-10-02 DIAGNOSIS — J309 Allergic rhinitis, unspecified: Secondary | ICD-10-CM

## 2015-10-09 ENCOUNTER — Ambulatory Visit: Payer: Medicare Other

## 2015-10-09 ENCOUNTER — Ambulatory Visit (INDEPENDENT_AMBULATORY_CARE_PROVIDER_SITE_OTHER): Payer: Medicare Other

## 2015-10-09 DIAGNOSIS — J309 Allergic rhinitis, unspecified: Secondary | ICD-10-CM | POA: Diagnosis not present

## 2015-10-16 ENCOUNTER — Ambulatory Visit (INDEPENDENT_AMBULATORY_CARE_PROVIDER_SITE_OTHER): Payer: Medicare Other

## 2015-10-16 DIAGNOSIS — J309 Allergic rhinitis, unspecified: Secondary | ICD-10-CM

## 2015-10-23 ENCOUNTER — Encounter: Payer: Self-pay | Admitting: Internal Medicine

## 2015-10-23 ENCOUNTER — Ambulatory Visit: Payer: Medicare Other

## 2015-10-23 ENCOUNTER — Ambulatory Visit (INDEPENDENT_AMBULATORY_CARE_PROVIDER_SITE_OTHER): Payer: Medicare Other | Admitting: Internal Medicine

## 2015-10-23 VITALS — BP 118/78 | HR 63 | Ht 60.0 in | Wt 115.0 lb

## 2015-10-23 DIAGNOSIS — J310 Chronic rhinitis: Secondary | ICD-10-CM | POA: Diagnosis not present

## 2015-10-23 DIAGNOSIS — J3089 Other allergic rhinitis: Secondary | ICD-10-CM

## 2015-10-23 DIAGNOSIS — J302 Other seasonal allergic rhinitis: Secondary | ICD-10-CM

## 2015-10-23 DIAGNOSIS — J309 Allergic rhinitis, unspecified: Secondary | ICD-10-CM | POA: Diagnosis not present

## 2015-10-23 DIAGNOSIS — J45998 Other asthma: Secondary | ICD-10-CM | POA: Diagnosis not present

## 2015-10-23 NOTE — Patient Instructions (Signed)
We are stopping allergy shots as of today and expect that she will do just fine. It would be okay to take an occasional Claritin if you need an antihistamine for nasal drainage or allergy problems.  Please call us if we can help

## 2015-10-23 NOTE — Assessment & Plan Note (Signed)
Probably nonallergic/irritant and vasomotor rhinitis. Minimal symptom control required-occasional antihistamine would be sufficient

## 2015-10-23 NOTE — Assessment & Plan Note (Signed)
Mild intermittent well-controlled and essentially inactive. No use of rescue inhaler and no sleep disturbance in a long time.

## 2015-10-23 NOTE — Assessment & Plan Note (Signed)
Symptoms have largely resolved. At her age, allergic rhinitis is less likely than nonallergic vasomotor and irritant rhinitis. Allergy vaccine has done what it can. Plan-she agreed to let us stop allergy vaccine.

## 2015-10-23 NOTE — Progress Notes (Signed)
Patient ID: Jeanette Montoya, female    DOB: 1922-07-21, 81 y.o.   MRN: [id number]  HPI 07/12/11- [age] yoF never smoker followed for allergic rhinitis, allergic conjunctivitis, asthma, complicated by HBP. Last here February 03, 2011- note reviewed In the past week she has been having episodes of shortness of breath, hard to get satisfying breath, feeling somehow associated with right side of neck.. Maybe some cough, very scant mucus. Denies pain, palpitation, syncope. Proair didn't help. Today feels well. Can't identify an associated trigger, relief or situation. Spells last a few minutes, self limited. Denies swelling or aching in legs.  Still bothered by chronic mattering from eyes. The allergy shots help and she is choosing to continue. 1:10 GH  01/10/12- 88 yoF never smoker followed for allergic rhinitis, allergic conjunctivitis, asthma, complicated by HBP. She denies shortness of breath at this time has felt fairly stable with no cough. She still complains of thickened mucoid drainage from her eyes with little conjunctival burning or itching. She is treated for glaucoma.  07/09/12- 88 yoF never smoker followed for allergic rhinitis, allergic conjunctivitis, asthma, complicated by HBP. Denies any cough or sob at this time  Allergy problems were "not too bad" this spring but humidity is bothering her now. She says her allergy shots are "good for me". She has had a couple of lightheaded or fainting episodes with no injury. She can't describe them very well but they don't seem related to her breathing.  01/11/13  88 yoF never smoker followed for allergic rhinitis, allergic conjunctivitis, asthma, complicated by HBP. Here with a family member FOLLOWS FOR: still on vaccine 1:10 GH and doing well; denies any flare ups or reactions at this time. Has not wheezed in a long time and no longer has rescue inhaler. For surgery on tear duct left eye, which may help her chronic complaint of "matter" from her  eye.  04/12/13- 88 yoF never smoker followed for allergic rhinitis, allergic conjunctivitis, asthma, complicated by HBP. Here with a family member FOLLOWS FOR: still on vaccine and doing well; denies any flare ups with pollen increase at this time. Doing well. Continues allergy vaccine 1:10 GH. Needing only occasional zyrtec this Spring.  10/18/13- 89 yoF never smoker followed for allergic rhinitis, allergic conjunctivitis, asthma, complicated by HBP. Here with son FOLLOWS FOR: still on allergy vaccine 1:10 GH and doing well; runny nose-clear. She feels she has done fairly well over the past 6 months with no acute events We discussed allergy vaccine and options for any histamines. I am recommending she stop allergy vaccine because I think it has done all it can for her at this age  65/30/15- 89 yoF never smoker followed for allergic rhinitis, allergic conjunctivitis, asthma, complicated by HBP. Here with son FOLLOWS FOR:  Allergy Vaccine 1:10 GH doing well,  No concerns today At last visit we had discussed stopping vaccine. She has tended about this. Noting watery rhinorrhea. We had decided to try getting her shots every other week but her son came back as they were leaving to say she wanted to continue each week. We had planned to use up her current vaccine supply then stop. If they have changed their mind about this, we will hear later.  10/17/14- 89 yoF never smoker followed for allergic rhinitis, allergic conjunctivitis, asthma, complicated by HBP. Here with son FOLLOWS FOR:  Allergy vaccine 1:10 GH doing well for the most part. Patient complains of let eye watering. Overflow lacrimation has been a chronic  issue for her. We discussed her glaucoma affecting medication choices.  04/17/15- 89 yoF never smoker followed for allergic rhinitis, allergic conjunctivitis, asthma, complicated by HBP, glaucoma. Here with son FOLLOWS FOR: still on  Allergy vaccine 1:10 GH and doing well; denies any  itchy,watery eyes, sneezing,etc.  10/23/15- 81 year old female never smoker followed for allergic rhinitis, allergic conjunctivitis, asthma, complicated by HBP, glaucoma. Here with son Allergy vaccine 1:10 GH She admits she is doing "all right" with no significant acute events through the summer or early fall. Has had flu shot. Son says she still has some conjunctival drainage at times. We discussed allergy vaccine. I explained that it had probably done all it could for her and it is time to see how she does without it.  Review of Systems- HPI Constitutional:   No-   weight loss, night sweats, fevers, chills, fatigue, lassitude. HEENT:   No-  headaches, difficulty swallowing, tooth/dental problems, sore throat,       No-  sneezing, itching, ear ache, nasal congestion, +post nasal drip,  CV:  No-   chest pain, orthopnea, PND, swelling in lower extremities, anasarca, dizziness, palpitations Resp: No-   shortness of breath with exertion or at rest.              No-   productive cough,  No non-productive cough,  No- coughing up of blood.              No-   change in color of mucus.  No- wheezing.   Skin: No-   rash or lesions. GI:  No-   heartburn, indigestion, abdominal pain, nausea, vomiting,  GU: MS:  No-   joint pain or swelling.   Neuro-     nothing unusual Psych:  No- change in mood or affect. No depression or anxiety.  No memory loss.  Objective:   Physical Exam General- Alert, Oriented, Affect-appropriate, Distress- none acute    Small, elderly lady Skin- rash-none, lesions- none, excoriation- none Lymphadenopathy- none Head- atraumatic            Eyes- Gross vision intact, PERRLA,  clear secretions,, not injected..             Ears- Hearing, canals grossly normal            Nose- +pale mucosa, watery mucus, No-Septal dev, polyps, erosion, perforation             Throat- Mallampati II , mucosa clear , drainage- none, tonsils- atrophic Neck- flexible , trachea midline, no stridor ,  thyroid nl, carotid no bruit Chest - symmetrical excursion , unlabored           Heart/CV- RRR , 1/6 syst murmur LUSB , no gallop  , no rub, nl s1 s2                           - JVD- none , edema- none, stasis changes- none, varices- none           Lung- clear to P&A, wheeze- none, cough- none , dullness-none, rub- none           Chest wall-  Abd-  Br/ Gen/ Rectal- Not done, not indicated Extrem- cyanosis- none, clubbing, none, atrophy- none, strength- nl Neuro- grossly intact to observation

## 2016-04-26 ENCOUNTER — Ambulatory Visit (INDEPENDENT_AMBULATORY_CARE_PROVIDER_SITE_OTHER): Payer: Medicare Other | Admitting: Nurse Practitioner

## 2016-04-26 ENCOUNTER — Encounter: Payer: Self-pay | Admitting: Nurse Practitioner

## 2016-04-26 VITALS — BP 111/60 | HR 63 | Ht 60.0 in | Wt 114.2 lb

## 2016-04-26 DIAGNOSIS — R413 Other amnesia: Secondary | ICD-10-CM

## 2016-04-26 DIAGNOSIS — G309 Alzheimer's disease, unspecified: Secondary | ICD-10-CM

## 2016-04-26 DIAGNOSIS — F0280 Dementia in other diseases classified elsewhere without behavioral disturbance: Secondary | ICD-10-CM | POA: Diagnosis not present

## 2016-04-26 NOTE — Progress Notes (Signed)
I have reviewed and agreed above plan. 

## 2016-04-26 NOTE — Patient Instructions (Signed)
Continue Aricept 10 mg daily Continue Namenda 10 mg twice a day refill Follow-up yearly and when necessary
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
## 2016-05-13 ENCOUNTER — Other Ambulatory Visit: Payer: Self-pay | Admitting: Neurology

## 2016-05-16 ENCOUNTER — Other Ambulatory Visit: Payer: Self-pay | Admitting: Neurology

## 2017-04-18 ENCOUNTER — Ambulatory Visit
Admission: RE | Admit: 2017-04-18 | Discharge: 2017-04-18 | Disposition: A | Discharge status: Home / Self Care | Payer: Medicare Other | Source: Ambulatory Visit | Attending: Family Medicine | Admitting: Family Medicine

## 2017-04-18 ENCOUNTER — Other Ambulatory Visit: Payer: Self-pay | Admitting: Family Medicine

## 2017-04-18 DIAGNOSIS — M25551 Pain in right hip: Secondary | ICD-10-CM

## 2017-04-26 ENCOUNTER — Encounter: Payer: Self-pay | Admitting: Nurse Practitioner

## 2017-04-26 ENCOUNTER — Ambulatory Visit (INDEPENDENT_AMBULATORY_CARE_PROVIDER_SITE_OTHER): Payer: Medicare Other | Admitting: Nurse Practitioner

## 2017-04-26 VITALS — BP 129/73 | HR 71 | Ht 60.0 in | Wt 99.4 lb

## 2017-04-26 DIAGNOSIS — F0280 Dementia in other diseases classified elsewhere without behavioral disturbance: Secondary | ICD-10-CM | POA: Diagnosis not present

## 2017-04-26 DIAGNOSIS — R413 Other amnesia: Secondary | ICD-10-CM | POA: Diagnosis not present

## 2017-04-26 DIAGNOSIS — G309 Alzheimer's disease, unspecified: Secondary | ICD-10-CM

## 2017-04-26 MED ORDER — MEMANTINE HCL 10 MG PO TABS: 10.0000 mg | ORAL_TABLET | Freq: Two times a day (BID) | ORAL | 3 refills | Status: AC

## 2017-04-26 MED ORDER — DONEPEZIL HCL 10 MG PO TABS: 10.0000 mg | ORAL_TABLET | Freq: Every day | ORAL | 3 refills | Status: AC

## 2017-04-26 NOTE — Patient Instructions (Signed)
Continue Aricept 10 mg daily will refill Continue Namenda 10 mg twice a day will refill Reduce confusion, keep familiar objects and people around, stick to a routine Use effective communication such as simple words and short sentences Reduce nighttime restlessness, a consistent nighttime routine,  avoid napping during the day Encourage good nutrition and hydration Follow-up yearly and when necessary, next with Dr. Terrace ArabiaYan
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
## 2017-04-30 ENCOUNTER — Encounter (HOSPITAL_COMMUNITY): Payer: Self-pay | Admitting: Emergency Medicine

## 2017-04-30 ENCOUNTER — Emergency Department (HOSPITAL_COMMUNITY): Payer: Medicare Other

## 2017-04-30 ENCOUNTER — Inpatient Hospital Stay (HOSPITAL_COMMUNITY)
Admission: EM | Admit: 2017-04-30 | Discharge: 2017-05-05 | DRG: 469 | Disposition: A | Discharge status: Skilled Nursing Facility | Payer: Medicare Other | Attending: Internal Medicine | Admitting: Internal Medicine

## 2017-04-30 DIAGNOSIS — Z9889 Other specified postprocedural states: Secondary | ICD-10-CM

## 2017-04-30 DIAGNOSIS — F0390 Unspecified dementia without behavioral disturbance: Secondary | ICD-10-CM | POA: Diagnosis present

## 2017-04-30 DIAGNOSIS — Z01810 Encounter for preprocedural cardiovascular examination: Secondary | ICD-10-CM | POA: Diagnosis not present

## 2017-04-30 DIAGNOSIS — D62 Acute posthemorrhagic anemia: Secondary | ICD-10-CM | POA: Diagnosis not present

## 2017-04-30 DIAGNOSIS — J45909 Unspecified asthma, uncomplicated: Secondary | ICD-10-CM | POA: Diagnosis present

## 2017-04-30 DIAGNOSIS — E43 Unspecified severe protein-calorie malnutrition: Secondary | ICD-10-CM | POA: Diagnosis present

## 2017-04-30 DIAGNOSIS — E041 Nontoxic single thyroid nodule: Secondary | ICD-10-CM | POA: Diagnosis present

## 2017-04-30 DIAGNOSIS — Y92098 Other place in other non-institutional residence as the place of occurrence of the external cause: Secondary | ICD-10-CM

## 2017-04-30 DIAGNOSIS — S72002A Fracture of unspecified part of neck of left femur, initial encounter for closed fracture: Secondary | ICD-10-CM | POA: Diagnosis present

## 2017-04-30 DIAGNOSIS — R011 Cardiac murmur, unspecified: Secondary | ICD-10-CM | POA: Diagnosis not present

## 2017-04-30 DIAGNOSIS — E785 Hyperlipidemia, unspecified: Secondary | ICD-10-CM | POA: Diagnosis present

## 2017-04-30 DIAGNOSIS — D696 Thrombocytopenia, unspecified: Secondary | ICD-10-CM | POA: Diagnosis present

## 2017-04-30 DIAGNOSIS — W1830XA Fall on same level, unspecified, initial encounter: Secondary | ICD-10-CM | POA: Diagnosis present

## 2017-04-30 DIAGNOSIS — I129 Hypertensive chronic kidney disease with stage 1 through stage 4 chronic kidney disease, or unspecified chronic kidney disease: Secondary | ICD-10-CM | POA: Diagnosis present

## 2017-04-30 DIAGNOSIS — D649 Anemia, unspecified: Secondary | ICD-10-CM | POA: Diagnosis present

## 2017-04-30 DIAGNOSIS — S72052A Unspecified fracture of head of left femur, initial encounter for closed fracture: Secondary | ICD-10-CM | POA: Diagnosis present

## 2017-04-30 DIAGNOSIS — N184 Chronic kidney disease, stage 4 (severe): Secondary | ICD-10-CM | POA: Diagnosis present

## 2017-04-30 DIAGNOSIS — E876 Hypokalemia: Secondary | ICD-10-CM | POA: Diagnosis present

## 2017-04-30 DIAGNOSIS — N183 Chronic kidney disease, stage 3 unspecified: Secondary | ICD-10-CM | POA: Diagnosis present

## 2017-04-30 DIAGNOSIS — I10 Essential (primary) hypertension: Secondary | ICD-10-CM | POA: Diagnosis not present

## 2017-04-30 DIAGNOSIS — H409 Unspecified glaucoma: Secondary | ICD-10-CM | POA: Diagnosis present

## 2017-04-30 LAB — CBC WITH DIFFERENTIAL/PLATELET
BASOS ABS: 0 10*3/uL (ref 0.0–0.1)
BASOS PCT: 0 %
EOS ABS: 0 10*3/uL (ref 0.0–0.7)
Eosinophils Relative: 1 %
HCT: 34.4 % — ABNORMAL LOW (ref 36.0–46.0)
HEMOGLOBIN: 11.6 g/dL — AB (ref 12.0–15.0)
Lymphocytes Relative: 15 %
Lymphs Abs: 0.9 10*3/uL (ref 0.7–4.0)
MCH: 33.4 pg (ref 26.0–34.0)
MCHC: 33.7 g/dL (ref 30.0–36.0)
MCV: 99.1 fL (ref 78.0–100.0)
Monocytes Absolute: 0.3 10*3/uL (ref 0.1–1.0)
Monocytes Relative: 5 %
NEUTROS ABS: 5.1 10*3/uL (ref 1.7–7.7)
NEUTROS PCT: 79 %
Platelets: 122 10*3/uL — ABNORMAL LOW (ref 150–400)
RBC: 3.47 MIL/uL — AB (ref 3.87–5.11)
RDW: 14.3 % (ref 11.5–15.5)
WBC: 6.3 10*3/uL (ref 4.0–10.5)

## 2017-04-30 LAB — TYPE AND SCREEN
ABO/RH(D): B POS
Antibody Screen: NEGATIVE

## 2017-04-30 LAB — URINALYSIS, ROUTINE W REFLEX MICROSCOPIC
BILIRUBIN URINE: NEGATIVE
Glucose, UA: NEGATIVE mg/dL
HGB URINE DIPSTICK: NEGATIVE
Ketones, ur: 20 mg/dL — AB
Leukocytes, UA: NEGATIVE
NITRITE: NEGATIVE
PROTEIN: NEGATIVE mg/dL
Specific Gravity, Urine: 1.021 (ref 1.005–1.030)
pH: 5 (ref 5.0–8.0)

## 2017-04-30 LAB — FERRITIN: Ferritin: 101 ng/mL (ref 11–307)

## 2017-04-30 LAB — TROPONIN I
TROPONIN I: 0.18 ng/mL — AB (ref <0.03)
Troponin I: 0.17 ng/mL (ref <0.03)

## 2017-04-30 LAB — FOLATE: Folate: 48.7 ng/mL (ref >5.9)

## 2017-04-30 LAB — TSH: TSH: 2.513 u[IU]/mL (ref 0.350–4.500)

## 2017-04-30 LAB — SURGICAL PCR SCREEN
MRSA, PCR: NEGATIVE
Staphylococcus aureus: NEGATIVE

## 2017-04-30 LAB — BASIC METABOLIC PANEL
ANION GAP: 13 (ref 5–15)
BUN: 17 mg/dL (ref 6–20)
CALCIUM: 10 mg/dL (ref 8.9–10.3)
CO2: 26 mmol/L (ref 22–32)
CREATININE: 1.71 mg/dL — AB (ref 0.44–1.00)
Chloride: 103 mmol/L (ref 101–111)
GFR, EST AFRICAN AMERICAN: 29 mL/min — AB (ref >60)
GFR, EST NON AFRICAN AMERICAN: 25 mL/min — AB (ref >60)
Glucose, Bld: 94 mg/dL (ref 65–99)
Potassium: 3.1 mmol/L — ABNORMAL LOW (ref 3.5–5.1)
SODIUM: 142 mmol/L (ref 135–145)

## 2017-04-30 LAB — CK: CK TOTAL: 98 U/L (ref 38–234)

## 2017-04-30 LAB — PROTIME-INR
INR: 1.15
PROTHROMBIN TIME: 14.8 s (ref 11.4–15.2)

## 2017-04-30 LAB — IRON AND TIBC
Iron: 95 ug/dL (ref 28–170)
SATURATION RATIOS: 38 % — AB (ref 10.4–31.8)
TIBC: 252 ug/dL (ref 250–450)
UIBC: 157 ug/dL

## 2017-04-30 LAB — RETICULOCYTES
RBC.: 3.47 MIL/uL — ABNORMAL LOW (ref 3.87–5.11)
Retic Count, Absolute: 83.3 10*3/uL (ref 19.0–186.0)
Retic Ct Pct: 2.4 % (ref 0.4–3.1)

## 2017-04-30 LAB — GLUCOSE, CAPILLARY: GLUCOSE-CAPILLARY: 87 mg/dL (ref 65–99)

## 2017-04-30 LAB — MAGNESIUM: MAGNESIUM: 2.2 mg/dL (ref 1.7–2.4)

## 2017-04-30 LAB — VITAMIN B12: VITAMIN B 12: 1380 pg/mL — AB (ref 180–914)

## 2017-04-30 LAB — ABO/RH: ABO/RH(D): B POS

## 2017-04-30 MED ORDER — POTASSIUM CHLORIDE IN NACL 20-0.9 MEQ/L-% IV SOLN
INTRAVENOUS | Status: DC
  Administered 2017-04-30: 16:00:00 via INTRAVENOUS
  Filled 2017-04-30: qty 1000

## 2017-04-30 MED ORDER — BRIMONIDINE TARTRATE 0.15 % OP SOLN
1.0000 [drp] | Freq: Three times a day (TID) | OPHTHALMIC | Status: DC
  Administered 2017-04-30 – 2017-05-05 (×15): 1 [drp] via OPHTHALMIC
  Filled 2017-04-30: qty 5

## 2017-04-30 MED ORDER — METHOCARBAMOL 1000 MG/10ML IJ SOLN
500.0000 mg | Freq: Four times a day (QID) | INTRAVENOUS | Status: DC | PRN
  Filled 2017-04-30: qty 5

## 2017-04-30 MED ORDER — ENOXAPARIN SODIUM 40 MG/0.4ML ~~LOC~~ SOLN: 40.0000 mg | SUBCUTANEOUS | Status: DC

## 2017-04-30 MED ORDER — BOOST / RESOURCE BREEZE PO LIQD
1.0000 | Freq: Three times a day (TID) | ORAL | Status: DC
  Administered 2017-04-30: 1 via ORAL

## 2017-04-30 MED ORDER — DORZOLAMIDE HCL 2 % OP SOLN
1.0000 [drp] | Freq: Two times a day (BID) | OPHTHALMIC | Status: DC
  Administered 2017-04-30 – 2017-05-05 (×11): 1 [drp] via OPHTHALMIC
  Filled 2017-04-30: qty 10

## 2017-04-30 MED ORDER — BETAXOLOL HCL 0.5 % OP SOLN
1.0000 [drp] | Freq: Two times a day (BID) | OPHTHALMIC | Status: DC
  Administered 2017-04-30 – 2017-05-05 (×11): 1 [drp] via OPHTHALMIC
  Filled 2017-04-30: qty 5

## 2017-04-30 MED ORDER — POLYETHYLENE GLYCOL 3350 17 G PO PACK: 17.0000 g | PACK | Freq: Every day | ORAL | Status: DC | PRN

## 2017-04-30 MED ORDER — DEXTROSE-NACL 5-0.9 % IV SOLN: INTRAVENOUS | Status: DC

## 2017-04-30 MED ORDER — HYDRALAZINE HCL 20 MG/ML IJ SOLN: 10.0000 mg | Freq: Three times a day (TID) | INTRAMUSCULAR | Status: DC | PRN

## 2017-04-30 MED ORDER — MORPHINE SULFATE (PF) 4 MG/ML IV SOLN
2.0000 mg | INTRAVENOUS | Status: DC | PRN
  Administered 2017-04-30: 2 mg via INTRAVENOUS
  Filled 2017-04-30: qty 1

## 2017-04-30 MED ORDER — LACTATED RINGERS IV SOLN
INTRAVENOUS | Status: DC
  Administered 2017-05-01: 1 mL via INTRAVENOUS

## 2017-04-30 MED ORDER — DONEPEZIL HCL 10 MG PO TABS
10.0000 mg | ORAL_TABLET | Freq: Every day | ORAL | Status: DC
  Administered 2017-05-01 – 2017-05-05 (×3): 10 mg via ORAL
  Filled 2017-04-30 (×6): qty 1

## 2017-04-30 MED ORDER — LATANOPROST 0.005 % OP SOLN
1.0000 [drp] | Freq: Every day | OPHTHALMIC | Status: DC
  Administered 2017-04-30 – 2017-05-04 (×5): 1 [drp] via OPHTHALMIC
  Filled 2017-04-30: qty 2.5

## 2017-04-30 MED ORDER — POTASSIUM CHLORIDE CRYS ER 20 MEQ PO TBCR
40.0000 meq | EXTENDED_RELEASE_TABLET | Freq: Once | ORAL | Status: AC
  Administered 2017-04-30: 40 meq via ORAL
  Filled 2017-04-30: qty 2

## 2017-04-30 MED ORDER — HYDROCODONE-ACETAMINOPHEN 5-325 MG PO TABS
1.0000 | ORAL_TABLET | Freq: Four times a day (QID) | ORAL | Status: DC | PRN
Start: 2017-04-30 — End: 2017-05-02

## 2017-04-30 MED ORDER — MORPHINE SULFATE (PF) 4 MG/ML IV SOLN
0.5000 mg | INTRAVENOUS | Status: DC | PRN
  Administered 2017-04-30 – 2017-05-01 (×2): 1 mg via INTRAVENOUS
  Filled 2017-04-30 (×2): qty 1

## 2017-04-30 MED ORDER — ENOXAPARIN SODIUM 30 MG/0.3ML ~~LOC~~ SOLN
30.0000 mg | SUBCUTANEOUS | Status: DC
  Administered 2017-05-02 – 2017-05-05 (×4): 30 mg via SUBCUTANEOUS
  Filled 2017-04-30 (×5): qty 0.3

## 2017-04-30 MED ORDER — KCL IN DEXTROSE-NACL 20-5-0.9 MEQ/L-%-% IV SOLN
INTRAVENOUS | Status: DC
  Administered 2017-04-30: 1 mL via INTRAVENOUS
  Administered 2017-05-01 – 2017-05-02 (×2): via INTRAVENOUS
  Filled 2017-04-30 (×6): qty 1000

## 2017-04-30 MED ORDER — ACETAMINOPHEN 500 MG PO TABS: 1000.0000 mg | ORAL_TABLET | Freq: Once | ORAL | Status: DC

## 2017-04-30 MED ORDER — ASPIRIN EC 325 MG PO TBEC: 325.0000 mg | DELAYED_RELEASE_TABLET | Freq: Every day | ORAL | Status: DC

## 2017-04-30 MED ORDER — EZETIMIBE-SIMVASTATIN 10-20 MG PO TABS
1.0000 | ORAL_TABLET | Freq: Every day | ORAL | Status: DC
  Administered 2017-04-30 – 2017-05-04 (×4): 1 via ORAL
  Filled 2017-04-30 (×6): qty 1

## 2017-04-30 MED ORDER — DOCUSATE SODIUM 100 MG PO CAPS
100.0000 mg | ORAL_CAPSULE | Freq: Two times a day (BID) | ORAL | Status: DC
  Administered 2017-05-01 – 2017-05-05 (×4): 100 mg via ORAL
  Filled 2017-04-30 (×8): qty 1

## 2017-04-30 MED ORDER — VANCOMYCIN HCL IN DEXTROSE 1-5 GM/200ML-% IV SOLN
1000.0000 mg | INTRAVENOUS | Status: AC
  Administered 2017-05-01: 250 mg via INTRAVENOUS
  Filled 2017-04-30 (×2): qty 200

## 2017-04-30 MED ORDER — METHOCARBAMOL 500 MG PO TABS: 500.0000 mg | ORAL_TABLET | Freq: Four times a day (QID) | ORAL | Status: DC | PRN

## 2017-04-30 MED ORDER — CHLORHEXIDINE GLUCONATE 4 % EX LIQD
60.0000 mL | Freq: Once | CUTANEOUS | Status: AC
  Administered 2017-05-01: 4 via TOPICAL

## 2017-04-30 MED ORDER — MEMANTINE HCL 10 MG PO TABS
10.0000 mg | ORAL_TABLET | Freq: Two times a day (BID) | ORAL | Status: DC
  Administered 2017-04-30 – 2017-05-05 (×7): 10 mg via ORAL
  Filled 2017-04-30 (×11): qty 1

## 2017-04-30 MED ORDER — CEFAZOLIN SODIUM-DEXTROSE 2-4 GM/100ML-% IV SOLN: 2.0000 g | INTRAVENOUS | Status: DC

## 2017-04-30 NOTE — ED Triage Notes (Signed)
Pt bib [facility] from home s/p fall from standing.  Son heard her fall as she was going to the bathroom; son stood her up and noted she was favoring her L leg and was exhibiting pain.  Pt is alert and nonverbal at baseline, has dementia; no anticoagulation. EMS put pelvic binder in place, no deformities or bruising reported.

## 2017-04-30 NOTE — Progress Notes (Addendum)
Surgical risk: Chales AbrahamsGupta Perioperative Risk: 0.93 %  (Age 83, Cr > 1.5, ASA 2, partially dependent mobility, orthopedic procedure)  Screening troponin completed due to unwitnessed fall possibly related to syncope. Initial troponin 0.17 and patient does have nonspecific ST segment changes on EKG with no EKG for comparison. Mild elevation in troponin possibly related to chronic kidney disease stage IV. We'll continue to cycle troponin and obtain baseline echocardiogram. Patient is currently hemodynamically stable and at this time benefit of surgery continues to outweigh risk.  Junious SilkAllison Ellis, ANP

## 2017-04-30 NOTE — Consult Note (Signed)
ORTHOPAEDIC CONSULTATION  REQUESTING PHYSICIAN: Elwin Mocha, MD  Chief Complaint: left hip fracture  HPI: Jeanette Montoya is a 83 y.o. female who complains of  A mechanical fall. She is nonverbal  Past Medical History:  Diagnosis Date  . Allergic conjunctivitis   . ALLERGIC RHINITIS   . Asthma   . Dementia   . Glaucoma   . Hypertension   . Mental disorder    Past Surgical History:  Procedure Laterality Date  . dental extraction    . GLAUCOMA SURGERY    . TOTAL ABDOMINAL HYSTERECTOMY W/ BILATERAL SALPINGOOPHORECTOMY     Social History   Social History  . Marital status: Widowed    Spouse name: [name]   . Number of children: 2  . Years of education: 12   Occupational History  . previously worked in [employer]; retired Retired   Social History Main Topics  . Smoking status: Never Smoker  . Smokeless tobacco: Never Used  . Alcohol use No  . Drug use: No  . Sexual activity: Not Asked   Other Topics Concern  . None   Social History Narrative   Patient lives at home with son [name].    Patient has 2 children.    Patient is retired.    Patient is right handed.    Patient has a high school education.         Family History  Problem Relation Age of Onset  . Heart attack Father   . Heart attack Brother   . Heart attack Brother    Allergies  Allergen Reactions  . Penicillins Other (See Comments)    REACTION: fainting spells per pt swelling   Prior to Admission medications   Medication Sig Start Date End Date Taking? Authorizing Provider  aspirin 325 MG EC tablet Take 325 mg by mouth daily.   Yes [provider]  betaxolol (BETOPTIC-S) 0.5 % ophthalmic suspension Place 1 drop into both eyes 2 (two) times daily.  09/22/14  Yes [provider]  brimonidine (ALPHAGAN P) 0.1 % SOLN Place 1 drop into both eyes 2 (two) times daily.  06/25/12  Yes [provider]  donepezil (ARICEPT) 10 MG tablet Take 1 tablet (10 mg  total) by mouth daily. 04/26/17  Yes Dennie Bible, NP  dorzolamide (TRUSOPT) 2 % ophthalmic solution Place 1 drop into both eyes 2 (two) times daily.  09/22/14  Yes [provider]  ezetimibe-simvastatin (VYTORIN) 10-20 MG per tablet Take 1 tablet by mouth at bedtime.     Yes [provider]  hydrochlorothiazide 25 MG tablet Take 25 mg by mouth daily.     Yes [provider]  LUMIGAN 0.01 % SOLN Place 1 drop into both eyes at bedtime. 03/13/17  Yes [provider]  memantine (NAMENDA) 10 MG tablet Take 1 tablet (10 mg total) by mouth 2 (two) times daily. 04/26/17  Yes Dennie Bible, NP  traMADol (ULTRAM) 50 MG tablet Take 50 mg by mouth daily as needed for moderate pain.  04/13/16  Yes [provider]  nitroGLYCERIN (NITROSTAT) 0.4 MG SL tablet Place 0.4 mg under the tongue every 5 (five) minutes as needed for chest pain.     [provider]   Dg Chest 1 View  Result Date: 04/30/2017 CLINICAL DATA:  Left hip fracture. EXAM: CHEST 1 VIEW COMPARISON:  July 12, 2011 FINDINGS: The heart size and mediastinal contours are within normal limits. Both lungs are clear.  The visualized skeletal structures are unremarkable. IMPRESSION: No active disease. Electronically Signed   By: David  Williams III M.D   On: 04/30/2017 08:15   Ct Head Wo Contrast  Result Date: 04/30/2017 CLINICAL DATA:  Unwitnessed fall.  Dementia EXAM: CT HEAD WITHOUT CONTRAST CT CERVICAL SPINE WITHOUT CONTRAST TECHNIQUE: Multidetector CT imaging of the head and cervical spine was performed following the standard protocol without intravenous contrast. Multiplanar CT image reconstructions of the cervical spine were also generated. COMPARISON:  MRI head 11/02/2012 FINDINGS: CT HEAD FINDINGS Brain: Moderate atrophy. Chronic white matter hypodensity bilaterally. Negative for acute infarct, hemorrhage, or mass lesion. Vascular: Negative for hyperdense vessel. Skull: Negative for skull  fracture. Sinuses/Orbits: Negative.  Bilateral cataract removal. Other: None CT CERVICAL SPINE FINDINGS Alignment: Mild anterolisthesis C5-6 and C7-T1 Skull base and vertebrae: Negative for fracture Soft tissues and spinal canal: Left thyroid nodule with heterogeneous enhancement and coarse calcification. The nodule measures 3.1 x 2.4 cm. Neoplasm not excluded although this is most likely benign. Disc levels: Facet joint effusion bilaterally at C2-3. Facet degeneration most prominent on the right at C3-4 and C4-5. Minimal disc space narrowing. Upper chest: Negative Other: None IMPRESSION: No acute intracranial abnormality Negative for cervical spine fracture 3.1 x 2.4 cm left thyroid nodule with coarse calcification. This may represent benign or malignant nodule. Electronically Signed   By: Charles  Clark M.D.   On: 04/30/2017 08:42   Ct Cervical Spine Wo Contrast  Result Date: 04/30/2017 CLINICAL DATA:  Unwitnessed fall.  Dementia EXAM: CT HEAD WITHOUT CONTRAST CT CERVICAL SPINE WITHOUT CONTRAST TECHNIQUE: Multidetector CT imaging of the head and cervical spine was performed following the standard protocol without intravenous contrast. Multiplanar CT image reconstructions of the cervical spine were also generated. COMPARISON:  MRI head 11/02/2012 FINDINGS: CT HEAD FINDINGS Brain: Moderate atrophy. Chronic white matter hypodensity bilaterally. Negative for acute infarct, hemorrhage, or mass lesion. Vascular: Negative for hyperdense vessel. Skull: Negative for skull fracture. Sinuses/Orbits: Negative.  Bilateral cataract removal. Other: None CT CERVICAL SPINE FINDINGS Alignment: Mild anterolisthesis C5-6 and C7-T1 Skull base and vertebrae: Negative for fracture Soft tissues and spinal canal: Left thyroid nodule with heterogeneous enhancement and coarse calcification. The nodule measures 3.1 x 2.4 cm. Neoplasm not excluded although this is most likely benign. Disc levels: Facet joint effusion bilaterally at C2-3.  Facet degeneration most prominent on the right at C3-4 and C4-5. Minimal disc space narrowing. Upper chest: Negative Other: None IMPRESSION: No acute intracranial abnormality Negative for cervical spine fracture 3.1 x 2.4 cm left thyroid nodule with coarse calcification. This may represent benign or malignant nodule. Electronically Signed   By: Charles  Clark M.D.   On: 04/30/2017 08:42   Dg Hip Unilat With Pelvis 2-3 Views Left  Result Date: 04/30/2017 CLINICAL DATA:  Pain after fall. EXAM: DG HIP (WITH OR WITHOUT PELVIS) 2-3V LEFT COMPARISON:  None. FINDINGS: A left hip fracture is identified. No dislocation. The right hip and pelvic bones are otherwise grossly intact. IMPRESSION: Left hip fracture without dislocation. Electronically Signed   By: David  Williams III M.D   On: 04/30/2017 08:14    Positive ROS: All other systems have been reviewed and were otherwise negative with the exception of those mentioned in the HPI and as above.  Labs cbc  Recent Labs  04/30/17 0725  WBC 6.3  HGB 11.6*  HCT 34.4*  PLT 122*    Labs inflam No results for input(s): CRP in the last 72 hours.  Invalid input(s): ESR    Labs coag  Recent Labs  04/30/17 0725  INR 1.15     Recent Labs  04/30/17 0725  NA 142  K 3.1*  CL 103  CO2 26  GLUCOSE 94  BUN 17  CREATININE 1.71*  CALCIUM 10.0    Physical Exam: Vitals:   04/30/17 1000 04/30/17 1015  BP: 125/64 127/64  Pulse: 77 82  Resp: 16 18  Temp:     General: Alert, no acute distress Cardiovascular: No pedal edema Respiratory: No cyanosis, no use of accessory musculature GI: No organomegaly, abdomen is soft and non-tender Skin: No lesions in the area of chief complaint other than those listed below in MSK exam.  Neurologic: Sensation intact distally save for the below mentioned MSK exam Psychiatric: Patient is demented Lymphatic: No axillary or cervical lymphadenopathy  MUSCULOSKELETAL:  LLE: pain with ROM, some spontaneous  movement noted, 2+ pulses Other extremities are atraumatic with painless ROM and NVI.  Assessment: Left femoral neck fracture  Plan: Plan for OR Monday for L hip hemiarthroplasty Hold anticoagulation Monday AM.    ,  D, MD Cell [phone]   04/30/2017 10:39 AM    

## 2017-04-30 NOTE — ED Provider Notes (Signed)
[facility]-EMERGENCY DEPT Provider Note   CSN: [id number] Arrival date & time: 04/30/17  4098     History   Chief Complaint Chief Complaint  Patient presents with  . Fall    HPI Jeanette Montoya is a 83 y.o. female.  The history is provided by the patient.  Fall  This is a new problem. The current episode started 1 to 2 hours ago. The problem occurs rarely. The problem has not changed since onset.The symptoms are aggravated by twisting, standing and walking. Nothing relieves the symptoms. She has tried nothing for the symptoms.   83 year old female who presents after fall. She is not anticoagulated. Has a history of dementia, hypertension, hyperlipidemia and asthma. Per EMS, patient was going to the bathroom when I she fell. Her son was in the other room and heard a "thump". Patient was able to be assisted up by her son, but was noted to be favoring her left leg.  Level 5 caveat due to dementia.   Past Medical History:  Diagnosis Date  . Allergic conjunctivitis   . ALLERGIC RHINITIS   . Asthma   . Dementia   . Glaucoma   . Hypertension   . Mental disorder     Patient Active Problem List   Diagnosis Date Noted  . Thrombocytopenia (HCC) 04/30/2017  . Left thyroid nodule 04/30/2017  . HTN (hypertension) 04/30/2017  . Dementia 04/30/2017  . Asthma 04/30/2017  . Acute hypokalemia 04/30/2017  . Left displaced femoral neck fracture (HCC) 04/30/2017  . HLD (hyperlipidemia) 04/30/2017  . Alzheimer's disease 04/26/2016  . Mental disorder   . Memory loss 04/18/2014  . Other persistent mental disorders due to conditions classified elsewhere 08/15/2013  . Seasonal and perennial allergic rhinitis 02/18/2011  . Conjunctivitis, allergic, chronic 03/11/2008  . CHRONIC RHINITIS 02/07/2008  . HYPERTENSION 09/01/2007  . Allergic-infective asthma 09/01/2007    Past Surgical History:  Procedure Laterality Date  . dental extraction    . GLAUCOMA SURGERY    . TOTAL ABDOMINAL  HYSTERECTOMY W/ BILATERAL SALPINGOOPHORECTOMY      OB History    No data available       Home Medications    Prior to Admission medications   Medication Sig Start Date End Date Taking? Authorizing Provider  aspirin 325 MG EC tablet Take 325 mg by mouth daily.    [provider]  betaxolol (BETOPTIC-S) 0.5 % ophthalmic suspension  09/22/14   [provider]  bimatoprost (LUMIGAN) 0.03 % ophthalmic solution Place 1 drop into the right eye at bedtime.      [provider]  brimonidine (ALPHAGAN) 0.15 % ophthalmic solution  06/25/12   [provider]  donepezil (ARICEPT) 10 MG tablet Take 1 tablet (10 mg total) by mouth daily. 04/26/17   Nilda Riggs, NP  dorzolamide (TRUSOPT) 2 % ophthalmic solution Place 2 mLs into both eyes as needed. 09/22/14   [provider]  ezetimibe-simvastatin (VYTORIN) 10-20 MG per tablet Take 1 tablet by mouth at bedtime.      [provider]  hydrochlorothiazide 25 MG tablet Take 25 mg by mouth daily.      [provider]  memantine (NAMENDA) 10 MG tablet Take 1 tablet (10 mg total) by mouth 2 (two) times daily. 04/26/17   Nilda Riggs, NP  moxifloxacin (VIGAMOX) 0.5 % ophthalmic solution Place 1 drop into the left eye 4 (four) times daily.    [provider]  nitroGLYCERIN (NITROSTAT) 0.4 MG SL tablet Place  0.4 mg under the tongue every 5 (five) minutes as needed. For chest pain.    [provider]  NONFORMULARY OR COMPOUNDED ITEM Allergy Vaccine 1:10 [facility]    [provider]  traMADol (ULTRAM) 50 MG tablet 50 mg. As needed 04/13/16   [provider]    Family History Family History  Problem Relation Age of Onset  . Heart attack Father   . Heart attack Brother   . Heart attack Brother     Social History Social History  Substance Use Topics  . Smoking status: Never Smoker  . Smokeless tobacco: Never Used  . Alcohol use No      Allergies   Penicillins   Review of Systems Review of Systems  Unable to perform ROS: Dementia     Physical Exam Updated Vital Signs BP 122/62   Pulse 78   Temp 98.4 F (36.9 C) (Oral)   Resp 16   SpO2 95%   Physical Exam Physical Exam  Nursing note and vitals reviewed. Constitutional:  non-toxic, and in no acute distress Head: Normocephalic and atraumatic.  Mouth/Throat: Oropharynx is clear and moist.  Neck: Normal range of motion. Neck supple. No c-spine tenderness  Cardiovascular: Normal rate and regular rhythm.   +2 bilateral DP pulses. Pulmonary/Chest: Effort normal and breath sounds normal. no chest wall tenderness Abdominal: Soft. There is no tenderness. There is no rebound and no guarding.  Musculoskeletal: Left lower extremity is externally rotated and slightly shortened.  Neurological: Alert, no facial droop, primarily nonverbal, will obey simple commands, will wiggle toes and moves ankles bilaterally Skin: Skin is warm and dry.  Psychiatric: Cooperative   ED Treatments / Results  Labs (all labs ordered are listed, but only abnormal results are displayed) Labs Reviewed  BASIC METABOLIC PANEL - Abnormal; Notable for the following:       Result Value   Potassium 3.1 (*)    Creatinine, Ser 1.71 (*)    GFR calc non Af Amer 25 (*)    GFR calc Af Amer 29 (*)    All other components within normal limits  CBC WITH DIFFERENTIAL/PLATELET - Abnormal; Notable for the following:    RBC 3.47 (*)    Hemoglobin 11.6 (*)    HCT 34.4 (*)    Platelets 122 (*)    All other components within normal limits  PROTIME-INR  TYPE AND SCREEN  ABO/RH    EKG  EKG Interpretation  Date/Time:  Sunday Apr 30 2017 07:03:49 EDT Ventricular Rate:  85 PR Interval:    QRS Duration: 97 QT Interval:  412 QTC Calculation: 490 R Axis:   -85 Text Interpretation:  Sinus rhythm Left anterior fascicular block Probable anterior infarct, age indeterminate no prior EKG  Confirmed  by Brianda Beitler MD, Ahnesty Finfrock [phone]([id number]) on 04/30/2017 7:51:44 AM       Radiology Dg Chest 1 View  Result Date: 04/30/2017 CLINICAL DATA:  Left hip fracture. EXAM: CHEST 1 VIEW COMPARISON:  July 12, 2011 FINDINGS: The heart size and mediastinal contours are within normal limits. Both lungs are clear. The visualized skeletal structures are unremarkable. IMPRESSION: No active disease. Electronically Signed   By: Gerome Samavid  Williams III M.D   On: 04/30/2017 08:15   Ct Head Wo Contrast  Result Date: 04/30/2017 CLINICAL DATA:  Unwitnessed fall.  Dementia EXAM: CT HEAD WITHOUT CONTRAST CT CERVICAL SPINE WITHOUT CONTRAST TECHNIQUE: Multidetector CT imaging of the head and cervical spine was performed following the standard protocol without intravenous contrast.  Multiplanar CT image reconstructions of the cervical spine were also generated. COMPARISON:  MRI head 11/02/2012 FINDINGS: CT HEAD FINDINGS Brain: Moderate atrophy. Chronic white matter hypodensity bilaterally. Negative for acute infarct, hemorrhage, or mass lesion. Vascular: Negative for hyperdense vessel. Skull: Negative for skull fracture. Sinuses/Orbits: Negative.  Bilateral cataract removal. Other: None CT CERVICAL SPINE FINDINGS Alignment: Mild anterolisthesis C5-6 and C7-T1 Skull base and vertebrae: Negative for fracture Soft tissues and spinal canal: Left thyroid nodule with heterogeneous enhancement and coarse calcification. The nodule measures 3.1 x 2.4 cm. Neoplasm not excluded although this is most likely benign. Disc levels: Facet joint effusion bilaterally at C2-3. Facet degeneration most prominent on the right at C3-4 and C4-5. Minimal disc space narrowing. Upper chest: Negative Other: None IMPRESSION: No acute intracranial abnormality Negative for cervical spine fracture 3.1 x 2.4 cm left thyroid nodule with coarse calcification. This may represent benign or malignant nodule. Electronically Signed   By: Marlan Palau M.D.   On: 04/30/2017 08:42   Ct  Cervical Spine Wo Contrast  Result Date: 04/30/2017 CLINICAL DATA:  Unwitnessed fall.  Dementia EXAM: CT HEAD WITHOUT CONTRAST CT CERVICAL SPINE WITHOUT CONTRAST TECHNIQUE: Multidetector CT imaging of the head and cervical spine was performed following the standard protocol without intravenous contrast. Multiplanar CT image reconstructions of the cervical spine were also generated. COMPARISON:  MRI head 11/02/2012 FINDINGS: CT HEAD FINDINGS Brain: Moderate atrophy. Chronic white matter hypodensity bilaterally. Negative for acute infarct, hemorrhage, or mass lesion. Vascular: Negative for hyperdense vessel. Skull: Negative for skull fracture. Sinuses/Orbits: Negative.  Bilateral cataract removal. Other: None CT CERVICAL SPINE FINDINGS Alignment: Mild anterolisthesis C5-6 and C7-T1 Skull base and vertebrae: Negative for fracture Soft tissues and spinal canal: Left thyroid nodule with heterogeneous enhancement and coarse calcification. The nodule measures 3.1 x 2.4 cm. Neoplasm not excluded although this is most likely benign. Disc levels: Facet joint effusion bilaterally at C2-3. Facet degeneration most prominent on the right at C3-4 and C4-5. Minimal disc space narrowing. Upper chest: Negative Other: None IMPRESSION: No acute intracranial abnormality Negative for cervical spine fracture 3.1 x 2.4 cm left thyroid nodule with coarse calcification. This may represent benign or malignant nodule. Electronically Signed   By: Marlan Palau M.D.   On: 04/30/2017 08:42   Dg Hip Unilat With Pelvis 2-3 Views Left  Result Date: 04/30/2017 CLINICAL DATA:  Pain after fall. EXAM: DG HIP (WITH OR WITHOUT PELVIS) 2-3V LEFT COMPARISON:  None. FINDINGS: A left hip fracture is identified. No dislocation. The right hip and pelvic bones are otherwise grossly intact. IMPRESSION: Left hip fracture without dislocation. Electronically Signed   By: Gerome Sam III M.D   On: 04/30/2017 08:14    Procedures Procedures (including  critical care time)  Medications Ordered in ED Medications  morphine 4 MG/ML injection 2 mg (2 mg Intravenous Given 04/30/17 0727)     Initial Impression / Assessment and Plan / ED Course  I have reviewed the triage vital signs and the nursing notes.  Pertinent labs & imaging results that were available during my care of the patient were reviewed by me and considered in my medical decision making (see chart for details).     With closed left hip fracture. No other injuries on work-up. Extremity NV in tact.  Discussed with Dr. Orie Fisherman. NPO after midnight with surgical management tomorrow. Discussed with Junious Silk, NP. Will admit to hospitalist service.  Final Clinical Impressions(s) / ED Diagnoses   Final diagnoses:  Closed fracture of left  hip, initial encounter [facility])    New Prescriptions New Prescriptions   No medications on file     Lavera Guise, MD 04/30/17 [phone]

## 2017-04-30 NOTE — Progress Notes (Signed)
Pt for left hip hemiarthroplasty tomorrow am, I spoke to [employer] Technologies Corporation[employer]A George Westgate he said he will come here tomorrow at 10:00 to sign the consent, NPO midnight,inserted foley cath and send urine c/s and urinalysis  and PCR to the lab.

## 2017-04-30 NOTE — H&P (Signed)
History and Physical    Jeanette Montoya ZOX:[id number] DOB: [birth year] DOA: 04/30/2017   PCP: Blair Heys, MD   Patient coming from/Resides with: Private residence/son  Admission status: Inpatient/floor-medically necessary to stay a minimum 2 midnights to rule out impending and/or unexpected changes in physiologic status that may differ from initial evaluation performed in the ER and/or at time of admission. Presents with fall resulting in left femoral neck fracture. Patient will operative intervention this admission and will likely discharge to a skilled nursing facility for continued rehabilitative therapies. Patient has advanced dementia and is nonverbal. Routine hip fracture order set initiated. Will need PT/OT evaluation postoperatively. Incidental finding of a right nodule concerning for malignancy on CT scan therefore will need additional workup possibly while here versus as an outpatient.  Chief Complaint: Unwitnessed fall with left leg pain  HPI: Jeanette Montoya is a 83 y.o. female with medical history significant for advanced dementia followed by neurology in the outpatient setting, dyslipidemia, hypertension, asthma, glaucoma who was brought to the ER by EMS after expressing an unwitnessed fall at home with associated left leg pain. X-ray in the ER did reveal a left femoral neck fracture. CT of the head and neck showed no acute traumatic injuries but there was an incidental finding of a left thyroid nodule concerning for malignancy.  ED Course:  Vital Signs: BP 122/62   Pulse 78   Temp 98.4 F (36.9 C) (Oral)   Resp 16   SpO2 95%  DG hip: Left hip fracture without dislocation PCXR: Neg CT head and cervical spine without contrast: As above regarding the traumatic injuries to head or neck; incidental finding of 3.1 x 2.4; her left thyroid nodule with coarse calcification could be benign versus malignant nodule Lab data: Sodium 142, potassium 3.1, chloride 103, CO2 26,  glucose 94, BUN 17, creatinine 1.71, calcium 10, white count 6300 with neutrophils 79% and normal absolute neutrophils, hemoglobin 11.6, platelets 122,000, coags normal, type and screen has been completed Medications and treatments: Morphine 2 mg IV 1  Review of Systems:  In addition to the HPI above, **unable to obtain from patient with history being obtained from patient's son and chart No Fever-chills, myalgias or other constitutional symptoms No Headache, changes with Vision or hearing, new weakness, tingling, numbness in any extremity, dizziness, no dysarthria or word finding difficulty but does have minimal verbal response secondary to progressive dementia; + gait disturbance or imbalance, no tremors or seizure activity No problems swallowing food or Liquids, indigestion/reflux, choking or coughing while eating, abdominal pain with or after eating No Chest pain, Cough or Shortness of Breath, palpitations, orthopnea or DOE No Abdominal pain, N/V, melena,hematochezia, dark tarry stools, constipation No dysuria, malodorous urine, hematuria or flank pain No new skin rashes, lesions, masses or bruises, No new joint pains, aches, swelling or redness No recent unintentional weight gain  No polyuria, polydypsia or polyphagia   Past Medical History:  Diagnosis Date  . Allergic conjunctivitis   . ALLERGIC RHINITIS   . Asthma   . Dementia   . Glaucoma   . Hypertension   . Mental disorder     Past Surgical History:  Procedure Laterality Date  . dental extraction    . GLAUCOMA SURGERY    . TOTAL ABDOMINAL HYSTERECTOMY W/ BILATERAL SALPINGOOPHORECTOMY      Social History   Social History  . Marital status: Widowed    Spouse name: Greggory Stallion   . Number of children: 2  . Years of education:  12   Occupational History  . previously worked in [employer]; retired Retired   Social History Main Topics  . Smoking status: Never Smoker  . Smokeless tobacco: Never Used  . Alcohol use No    . Drug use: No  . Sexual activity: Not on file   Other Topics Concern  . Not on file   Social History Narrative   Patient lives at home with son Greggory Stallion.    Patient has 2 children.    Patient is retired.    Patient is right handed.    Patient has a high school education.          Mobility: Assistive devices Work history: Not obtained   Allergies  Allergen Reactions  . Penicillins Other (See Comments)    REACTION: fainting spells per pt swelling    Family History  Problem Relation Age of Onset  . Heart attack Father   . Heart attack Brother   . Heart attack Brother      Prior to Admission medications   Medication Sig Start Date End Date Taking? Authorizing Provider  aspirin 325 MG EC tablet Take 325 mg by mouth daily.   Yes [provider]  betaxolol (BETOPTIC-S) 0.5 % ophthalmic suspension  09/22/14   [provider]  bimatoprost (LUMIGAN) 0.03 % ophthalmic solution Place 1 drop into the right eye at bedtime.      [provider]  brimonidine (ALPHAGAN) 0.15 % ophthalmic solution  06/25/12   [provider]  donepezil (ARICEPT) 10 MG tablet Take 1 tablet (10 mg total) by mouth daily. 04/26/17   Nilda Riggs, NP  dorzolamide (TRUSOPT) 2 % ophthalmic solution Place 2 mLs into both eyes as needed. 09/22/14   [provider]  ezetimibe-simvastatin (VYTORIN) 10-20 MG per tablet Take 1 tablet by mouth at bedtime.      [provider]  hydrochlorothiazide 25 MG tablet Take 25 mg by mouth daily.      [provider]  memantine (NAMENDA) 10 MG tablet Take 1 tablet (10 mg total) by mouth 2 (two) times daily. 04/26/17   Nilda Riggs, NP  moxifloxacin (VIGAMOX) 0.5 % ophthalmic solution Place 1 drop into the left eye 4 (four) times daily.    [provider]  nitroGLYCERIN (NITROSTAT) 0.4 MG SL tablet Place 0.4 mg under the tongue every 5 (five) minutes as needed for chest pain.     [provider]  traMADol (ULTRAM) 50 MG tablet 50 mg. As needed 04/13/16   [provider]    Physical Exam: Vitals:   04/30/17 0800 04/30/17 0815 04/30/17 0845 04/30/17 0900  BP: 103/68  128/64 122/62  Pulse: 95 88 82 78  Resp:  (!) 28 16 16   Temp:      TempSrc:      SpO2: 100% 100% 93% 95%      Constitutional: NAD, calm, comfortable-Appears quite underweight and cachectic Eyes: PERRL, lids and conjunctivae normal ENMT: Mucous membranes are dry. Posterior pharynx clear of any exudate or lesions. Edentulous  Neck: normal, supple, no masses, no thyromegaly or palpable nodules Respiratory: clear to auscultation bilaterally, no wheezing, no crackles. Normal respiratory effort. No accessory muscle use.  Cardiovascular: Regular rate and rhythm, no murmurs / rubs / gallops. No extremity edema. 2+ pedal pulses. No carotid bruits.  Abdomen: no tenderness, no masses palpated. No hepatosplenomegaly. Bowel sounds positive.  Musculoskeletal: no clubbing / cyanosis. No joint deformity upper extremities. Left leg is externally rotated  with tenderness palpated over thigh region without obvious bruising or contusion Good ROM, no contractures. Normal muscle tone.  Skin: no rashes, lesions, ulcers. No induration Neurologic: CN 2-12 grossly intact. Sensation intact, DTR normal. Strength 5/5 x all 4 extremities.  Psychiatric: Alert but at baseline is essentially nonverbal secondary to dementia and therefore unable to accurately assess orientation; extremely flat affect; she was able to convey that her left leg was hurting   Labs on Admission: I have personally reviewed following labs and imaging studies  CBC:  Recent Labs Lab 04/30/17 0725  WBC 6.3  NEUTROABS 5.1  HGB 11.6*  HCT 34.4*  MCV 99.1  PLT 122*   Basic Metabolic Panel:  Recent Labs Lab 04/30/17 0725  NA 142  K 3.1*  CL 103  CO2 26  GLUCOSE 94  BUN 17  CREATININE 1.71*  CALCIUM 10.0   GFR: Estimated Creatinine  Clearance: 14.9 mL/min (A) (by C-G formula based on SCr of 1.71 mg/dL (H)). Liver Function Tests: No results for input(s): AST, ALT, ALKPHOS, BILITOT, PROT, ALBUMIN in the last 168 hours. No results for input(s): LIPASE, AMYLASE in the last 168 hours. No results for input(s): AMMONIA in the last 168 hours. Coagulation Profile:  Recent Labs Lab 04/30/17 0725  INR 1.15   Cardiac Enzymes: No results for input(s): CKTOTAL, CKMB, CKMBINDEX, TROPONINI in the last 168 hours. BNP (last 3 results) No results for input(s): PROBNP in the last 8760 hours. HbA1C: No results for input(s): HGBA1C in the last 72 hours. CBG: No results for input(s): GLUCAP in the last 168 hours. Lipid Profile: No results for input(s): CHOL, HDL, LDLCALC, TRIG, CHOLHDL, LDLDIRECT in the last 72 hours. Thyroid Function Tests: No results for input(s): TSH, T4TOTAL, FREET4, T3FREE, THYROIDAB in the last 72 hours. Anemia Panel: No results for input(s): VITAMINB12, FOLATE, FERRITIN, TIBC, IRON, RETICCTPCT in the last 72 hours. Urine analysis: No results found for: COLORURINE, APPEARANCEUR, LABSPEC, PHURINE, GLUCOSEU, HGBUR, BILIRUBINUR, KETONESUR, PROTEINUR, UROBILINOGEN, NITRITE, LEUKOCYTESUR Sepsis Labs: @LABRCNTIP (procalcitonin:4,lacticidven:4) )No results found for this or any previous visit (from the past 240 hour(s)).   Radiological Exams on Admission: Dg Chest 1 View  Result Date: 04/30/2017 CLINICAL DATA:  Left hip fracture. EXAM: CHEST 1 VIEW COMPARISON:  July 12, 2011 FINDINGS: The heart size and mediastinal contours are within normal limits. Both lungs are clear. The visualized skeletal structures are unremarkable. IMPRESSION: No active disease. Electronically Signed   By: Gerome Sam III M.D   On: 04/30/2017 08:15   Ct Head Wo Contrast  Result Date: 04/30/2017 CLINICAL DATA:  Unwitnessed fall.  Dementia EXAM: CT HEAD WITHOUT CONTRAST CT CERVICAL SPINE WITHOUT CONTRAST TECHNIQUE: Multidetector CT  imaging of the head and cervical spine was performed following the standard protocol without intravenous contrast. Multiplanar CT image reconstructions of the cervical spine were also generated. COMPARISON:  MRI head 11/02/2012 FINDINGS: CT HEAD FINDINGS Brain: Moderate atrophy. Chronic white matter hypodensity bilaterally. Negative for acute infarct, hemorrhage, or mass lesion. Vascular: Negative for hyperdense vessel. Skull: Negative for skull fracture. Sinuses/Orbits: Negative.  Bilateral cataract removal. Other: None CT CERVICAL SPINE FINDINGS Alignment: Mild anterolisthesis C5-6 and C7-T1 Skull base and vertebrae: Negative for fracture Soft tissues and spinal canal: Left thyroid nodule with heterogeneous enhancement and coarse calcification. The nodule measures 3.1 x 2.4 cm. Neoplasm not excluded although this is most likely benign. Disc levels: Facet joint effusion bilaterally at C2-3. Facet degeneration most prominent on the right at C3-4 and C4-5. Minimal disc space narrowing. Upper chest:  Negative Other: None IMPRESSION: No acute intracranial abnormality Negative for cervical spine fracture 3.1 x 2.4 cm left thyroid nodule with coarse calcification. This may represent benign or malignant nodule. Electronically Signed   By: Marlan Palauharles  Clark M.D.   On: 04/30/2017 08:42   Ct Cervical Spine Wo Contrast  Result Date: 04/30/2017 CLINICAL DATA:  Unwitnessed fall.  Dementia EXAM: CT HEAD WITHOUT CONTRAST CT CERVICAL SPINE WITHOUT CONTRAST TECHNIQUE: Multidetector CT imaging of the head and cervical spine was performed following the standard protocol without intravenous contrast. Multiplanar CT image reconstructions of the cervical spine were also generated. COMPARISON:  MRI head 11/02/2012 FINDINGS: CT HEAD FINDINGS Brain: Moderate atrophy. Chronic white matter hypodensity bilaterally. Negative for acute infarct, hemorrhage, or mass lesion. Vascular: Negative for hyperdense vessel. Skull: Negative for skull  fracture. Sinuses/Orbits: Negative.  Bilateral cataract removal. Other: None CT CERVICAL SPINE FINDINGS Alignment: Mild anterolisthesis C5-6 and C7-T1 Skull base and vertebrae: Negative for fracture Soft tissues and spinal canal: Left thyroid nodule with heterogeneous enhancement and coarse calcification. The nodule measures 3.1 x 2.4 cm. Neoplasm not excluded although this is most likely benign. Disc levels: Facet joint effusion bilaterally at C2-3. Facet degeneration most prominent on the right at C3-4 and C4-5. Minimal disc space narrowing. Upper chest: Negative Other: None IMPRESSION: No acute intracranial abnormality Negative for cervical spine fracture 3.1 x 2.4 cm left thyroid nodule with coarse calcification. This may represent benign or malignant nodule. Electronically Signed   By: Marlan Palauharles  Clark M.D.   On: 04/30/2017 08:42   Dg Hip Unilat With Pelvis 2-3 Views Left  Result Date: 04/30/2017 CLINICAL DATA:  Pain after fall. EXAM: DG HIP (WITH OR WITHOUT PELVIS) 2-3V LEFT COMPARISON:  None. FINDINGS: A left hip fracture is identified. No dislocation. The right hip and pelvic bones are otherwise grossly intact. IMPRESSION: Left hip fracture without dislocation. Electronically Signed   By: Gerome Samavid  Williams III M.D   On: 04/30/2017 08:14    EKG: (Independently reviewed) sinus rhythm with ventricular rate 85 bpm, QTC 490 ms, low voltage R waves in the inferior and lateral leads, nonspecific ST changes inferior lateral leads  Assessment/Plan Principal Problem:   Left displaced femoral neck fracture  -Presents with left leg pain after unwitnessed presumed mechanical fall with diagnostic evaluation consistent with left femoral neck fracture -Orthopedic team plan is for surgical intervention tomorrow on 5/14 -NPO after midnight -Appears dehydrated so we'll begin IV fluids -Combination oral and IV narcotics with IV muscle relaxants -Scheduled Colace -Insert Foley -SW consulted re: anticipated need  for postoperative rehabilitative therapies likely at SNF -Vitamin D serologies -Lovenox time one dose today-orthopedic team to determine when appropriate to resume pharmacological DVT prophylaxis postop  Active Problems:   Acute hypokalemia -Oral potassium 1 -Potassium to maintenance fluids    CKD (chronic kidney disease), stage III-IV -Baseline renal function unknown -Follow labs after hydration -Avoid nephrotoxic medications    Thrombocytopenia/Anemia -Platelets slightly decreased and greater than 100,000 -Hemoglobin 11 -Obtain anemia panel -Follow labs -Continue full dose aspirin for now    HTN (hypertension) -Currently controlled -Was on HCTZ prior to admission-appears dry-see below regarding concerns with dementia-hold diuretic    Asthma -Not actively wheezing and was not on MDI prior to admission    Left thyroid nodule -Incidental finding on CT at time of admission -Unable to locate with physical exam -TSH -May need thyroid ultrasound prior to discharge    Dementia/FTT -Continue preadmission Namenda and Aricept -Patient's weight has decreased nearly 20 pounds since  this time last year (115 lbs >> 99 lbs) -Nutrition consultation -Protein supplementation -Since unable to convey any symptoms we'll obtain urinalysis and urine culture to rule out UTI    HLD (hyperlipidemia) -Continue preadmission Vytorin    Glaucoma -Continue Alphagan, beta optic, Trusopt, and Lumigan      DVT prophylaxis: Lovenox 1 dose preop-additional prophylaxis at discretion of orthopedic team  Code Status: Full Family Communication: Son  Disposition Plan: To be determined postoperatively Consults called: Orthopedics/[place]    Stepehn Eckard L. ANP-BC [facility] Pager [phone]   If 7PM-7AM, please contact night-coverage [email or web address] Password [facility]  04/30/2017, 9:54 AM

## 2017-04-30 NOTE — ED Notes (Signed)
Attempted to call report

## 2017-04-30 NOTE — Progress Notes (Signed)
Pt unable to eat, drink or taking her meds, refused to open her mouth, hx of dementia, with IVF of NS + 20k at 77 and last CBG 87, NPO midnight for procedure tomorrow, on call Dr. Toniann FailKakrakandy ordered change the IVF to D5NSS + 20 K at 75.

## 2017-05-01 ENCOUNTER — Encounter (HOSPITAL_COMMUNITY)
Admission: EM | Disposition: A | Discharge status: Skilled Nursing Facility | Payer: Self-pay | Source: Home / Self Care | Attending: Internal Medicine

## 2017-05-01 ENCOUNTER — Inpatient Hospital Stay (HOSPITAL_COMMUNITY): Payer: Medicare Other

## 2017-05-01 ENCOUNTER — Inpatient Hospital Stay (HOSPITAL_COMMUNITY): Payer: Medicare Other | Admitting: Anesthesiology

## 2017-05-01 ENCOUNTER — Encounter (HOSPITAL_COMMUNITY): Payer: Self-pay | Admitting: Anesthesiology

## 2017-05-01 DIAGNOSIS — R011 Cardiac murmur, unspecified: Secondary | ICD-10-CM

## 2017-05-01 DIAGNOSIS — Z01810 Encounter for preprocedural cardiovascular examination: Secondary | ICD-10-CM

## 2017-05-01 DIAGNOSIS — S72002A Fracture of unspecified part of neck of left femur, initial encounter for closed fracture: Secondary | ICD-10-CM

## 2017-05-01 DIAGNOSIS — E43 Unspecified severe protein-calorie malnutrition: Secondary | ICD-10-CM | POA: Insufficient documentation

## 2017-05-01 HISTORY — PX: HIP ARTHROPLASTY: SHX981

## 2017-05-01 LAB — BASIC METABOLIC PANEL
Anion gap: 14 (ref 5–15)
BUN: 17 mg/dL (ref 6–20)
CALCIUM: 9 mg/dL (ref 8.9–10.3)
CO2: 24 mmol/L (ref 22–32)
CREATININE: 1.18 mg/dL — AB (ref 0.44–1.00)
Chloride: 106 mmol/L (ref 101–111)
GFR calc non Af Amer: 39 mL/min — ABNORMAL LOW (ref >60)
GFR, EST AFRICAN AMERICAN: 45 mL/min — AB (ref >60)
GLUCOSE: 112 mg/dL — AB (ref 65–99)
Potassium: 3.6 mmol/L (ref 3.5–5.1)
Sodium: 144 mmol/L (ref 135–145)

## 2017-05-01 LAB — CBC
HEMATOCRIT: 33.1 % — AB (ref 36.0–46.0)
Hemoglobin: 11.2 g/dL — ABNORMAL LOW (ref 12.0–15.0)
MCH: 33.6 pg (ref 26.0–34.0)
MCHC: 33.8 g/dL (ref 30.0–36.0)
MCV: 99.4 fL (ref 78.0–100.0)
Platelets: 97 10*3/uL — ABNORMAL LOW (ref 150–400)
RBC: 3.33 MIL/uL — ABNORMAL LOW (ref 3.87–5.11)
RDW: 14.6 % (ref 11.5–15.5)
WBC: 5 10*3/uL (ref 4.0–10.5)

## 2017-05-01 LAB — TROPONIN I
Troponin I: 0.18 ng/mL (ref <0.03)
Troponin I: 0.13 ng/mL (ref <0.03)

## 2017-05-01 LAB — ECHOCARDIOGRAM COMPLETE

## 2017-05-01 LAB — VITAMIN D 25 HYDROXY (VIT D DEFICIENCY, FRACTURES): Vit D, 25-Hydroxy: 62.8 ng/mL (ref 30.0–100.0)

## 2017-05-01 SURGERY — HEMIARTHROPLASTY, HIP, DIRECT ANTERIOR APPROACH, FOR FRACTURE
Anesthesia: General | Site: Hip | Laterality: Left

## 2017-05-01 MED ORDER — TRANEXAMIC ACID 1000 MG/10ML IV SOLN
1000.0000 mg | INTRAVENOUS | Status: AC
  Administered 2017-05-01: 1000 mg via INTRAVENOUS
  Filled 2017-05-01: qty 10

## 2017-05-01 MED ORDER — FENTANYL CITRATE (PF) 100 MCG/2ML IJ SOLN
INTRAMUSCULAR | Status: DC | PRN
  Administered 2017-05-01: 25 ug via INTRAVENOUS
  Administered 2017-05-01: 50 ug via INTRAVENOUS
  Administered 2017-05-01: 25 ug via INTRAVENOUS

## 2017-05-01 MED ORDER — ROCURONIUM BROMIDE 100 MG/10ML IV SOLN
INTRAVENOUS | Status: DC | PRN
  Administered 2017-05-01: 40 mg via INTRAVENOUS

## 2017-05-01 MED ORDER — ONDANSETRON HCL 4 MG/2ML IJ SOLN: 4.0000 mg | Freq: Once | INTRAMUSCULAR | Status: DC | PRN

## 2017-05-01 MED ORDER — DOCUSATE SODIUM 100 MG PO CAPS: 100.0000 mg | ORAL_CAPSULE | Freq: Two times a day (BID) | ORAL | Status: DC

## 2017-05-01 MED ORDER — CEFAZOLIN SODIUM-DEXTROSE 1-4 GM/50ML-% IV SOLN
INTRAVENOUS | Status: DC | PRN
  Administered 2017-05-01: 2 g via INTRAVENOUS

## 2017-05-01 MED ORDER — LACTATED RINGERS IV SOLN: INTRAVENOUS | Status: DC

## 2017-05-01 MED ORDER — ONDANSETRON HCL 4 MG/2ML IJ SOLN
INTRAMUSCULAR | Status: DC | PRN
  Administered 2017-05-01: 4 mg via INTRAVENOUS

## 2017-05-01 MED ORDER — PROPOFOL 10 MG/ML IV BOLUS
INTRAVENOUS | Status: AC
  Filled 2017-05-01: qty 20

## 2017-05-01 MED ORDER — ASPIRIN EC 325 MG PO TBEC: 325.0000 mg | DELAYED_RELEASE_TABLET | Freq: Every day | ORAL | Status: DC

## 2017-05-01 MED ORDER — 0.9 % SODIUM CHLORIDE (POUR BTL) OPTIME
TOPICAL | Status: DC | PRN
  Administered 2017-05-01: 1000 mL

## 2017-05-01 MED ORDER — SENNA 8.6 MG PO TABS
1.0000 | ORAL_TABLET | Freq: Two times a day (BID) | ORAL | Status: DC
  Administered 2017-05-01 – 2017-05-05 (×4): 8.6 mg via ORAL
  Filled 2017-05-01 (×7): qty 1

## 2017-05-01 MED ORDER — PHENOL 1.4 % MT LIQD: 1.0000 | OROMUCOSAL | Status: DC | PRN

## 2017-05-01 MED ORDER — POLYETHYLENE GLYCOL 3350 17 G PO PACK: 17.0000 g | PACK | Freq: Every day | ORAL | Status: DC | PRN

## 2017-05-01 MED ORDER — ALBUMIN HUMAN 5 % IV SOLN
INTRAVENOUS | Status: DC | PRN
  Administered 2017-05-01: 14:00:00 via INTRAVENOUS

## 2017-05-01 MED ORDER — MAGNESIUM CITRATE PO SOLN: 1.0000 | Freq: Once | ORAL | Status: DC | PRN

## 2017-05-01 MED ORDER — PROPOFOL 10 MG/ML IV BOLUS
INTRAVENOUS | Status: DC | PRN
  Administered 2017-05-01: 80 mg via INTRAVENOUS

## 2017-05-01 MED ORDER — SUGAMMADEX SODIUM 200 MG/2ML IV SOLN
INTRAVENOUS | Status: DC | PRN
  Administered 2017-05-01: 100 mg via INTRAVENOUS

## 2017-05-01 MED ORDER — MENTHOL 3 MG MT LOZG: 1.0000 | LOZENGE | OROMUCOSAL | Status: DC | PRN

## 2017-05-01 MED ORDER — PHENYLEPHRINE HCL 10 MG/ML IJ SOLN
INTRAMUSCULAR | Status: DC | PRN
  Administered 2017-05-01 (×3): 80 ug via INTRAVENOUS

## 2017-05-01 MED ORDER — CEFAZOLIN SODIUM-DEXTROSE 2-4 GM/100ML-% IV SOLN
2.0000 g | Freq: Four times a day (QID) | INTRAVENOUS | Status: AC
  Administered 2017-05-01 – 2017-05-02 (×2): 2 g via INTRAVENOUS
  Filled 2017-05-01 (×2): qty 100

## 2017-05-01 MED ORDER — LIDOCAINE HCL (CARDIAC) 20 MG/ML IV SOLN
INTRAVENOUS | Status: DC | PRN
  Administered 2017-05-01: 30 mg via INTRAVENOUS

## 2017-05-01 MED ORDER — FENTANYL CITRATE (PF) 250 MCG/5ML IJ SOLN
INTRAMUSCULAR | Status: AC
  Filled 2017-05-01: qty 5

## 2017-05-01 MED ORDER — FENTANYL CITRATE (PF) 100 MCG/2ML IJ SOLN: 25.0000 ug | INTRAMUSCULAR | Status: DC | PRN

## 2017-05-01 MED ORDER — BISACODYL 10 MG RE SUPP: 10.0000 mg | Freq: Every day | RECTAL | Status: DC | PRN

## 2017-05-01 MED ORDER — PHENYLEPHRINE HCL 10 MG/ML IJ SOLN
INTRAMUSCULAR | Status: DC | PRN
  Administered 2017-05-01: 50 ug/min via INTRAVENOUS

## 2017-05-01 MED ORDER — ENSURE ENLIVE PO LIQD
237.0000 mL | Freq: Two times a day (BID) | ORAL | Status: DC
  Administered 2017-05-04 – 2017-05-05 (×3): 237 mL via ORAL

## 2017-05-01 SURGICAL SUPPLY — 47 items
BIT DRILL 7/64X5 DISP (BIT) ×3 IMPLANT
BLADE SAGITTAL 25.0X1.27X90 (BLADE) ×2 IMPLANT
BLADE SAGITTAL 25.0X1.27X90MM (BLADE) ×1
CAPT HIP HEMI 2 ×3 IMPLANT
CLOSURE STERI-STRIP 1/2X4 (GAUZE/BANDAGES/DRESSINGS) ×2
CLOSURE WOUND 1/2 X4 (GAUZE/BANDAGES/DRESSINGS) ×1
CLSR STERI-STRIP ANTIMIC 1/2X4 (GAUZE/BANDAGES/DRESSINGS) ×4 IMPLANT
COVER SURGICAL LIGHT HANDLE (MISCELLANEOUS) ×3 IMPLANT
DRAPE ORTHO SPLIT 77X108 STRL (DRAPES) ×6
DRAPE SURG ORHT 6 SPLT 77X108 (DRAPES) ×2 IMPLANT
DRAPE U-SHAPE 47X51 STRL (DRAPES) ×3 IMPLANT
DRESSING ALLEVYN LIFE SACRUM (GAUZE/BANDAGES/DRESSINGS) ×2 IMPLANT
DRSG MEPILEX BORDER 4X8 (GAUZE/BANDAGES/DRESSINGS) ×3 IMPLANT
DURAPREP 26ML APPLICATOR (WOUND CARE) ×3 IMPLANT
ELECT BLADE 4.0 EZ CLEAN MEGAD (MISCELLANEOUS) ×3
ELECT CAUTERY BLADE 6.4 (BLADE) ×3 IMPLANT
ELECT REM PT RETURN 9FT ADLT (ELECTROSURGICAL) ×3
ELECTRODE BLDE 4.0 EZ CLN MEGD (MISCELLANEOUS) ×1 IMPLANT
ELECTRODE REM PT RTRN 9FT ADLT (ELECTROSURGICAL) ×1 IMPLANT
FACESHIELD WRAPAROUND (MASK) ×3 IMPLANT
FACESHIELD WRAPAROUND OR TEAM (MASK) ×1 IMPLANT
GLOVE BIO SURGEON STRL SZ7.5 (GLOVE) ×6 IMPLANT
GLOVE BIOGEL PI IND STRL 8 (GLOVE) ×2 IMPLANT
GLOVE BIOGEL PI INDICATOR 8 (GLOVE) ×4
GOWN STRL REUS W/ TWL LRG LVL3 (GOWN DISPOSABLE) ×2 IMPLANT
GOWN STRL REUS W/ TWL XL LVL3 (GOWN DISPOSABLE) ×1 IMPLANT
GOWN STRL REUS W/TWL LRG LVL3 (GOWN DISPOSABLE) ×9
GOWN STRL REUS W/TWL XL LVL3 (GOWN DISPOSABLE)
HIP CAPITATED HEMI 2 IMPLANT
KIT BASIN OR (CUSTOM PROCEDURE TRAY) ×3 IMPLANT
KIT ROOM TURNOVER OR (KITS) ×3 IMPLANT
MANIFOLD NEPTUNE II (INSTRUMENTS) ×3 IMPLANT
NS IRRIG 1000ML POUR BTL (IV SOLUTION) ×3 IMPLANT
PACK TOTAL JOINT (CUSTOM PROCEDURE TRAY) ×3 IMPLANT
PAD ARMBOARD 7.5X6 YLW CONV (MISCELLANEOUS) ×6 IMPLANT
PILLOW ABDUCTION HIP (SOFTGOODS) ×3 IMPLANT
RETRIEVER SUT HEWSON (MISCELLANEOUS) ×3 IMPLANT
STRIP CLOSURE SKIN 1/2X4 (GAUZE/BANDAGES/DRESSINGS) ×1 IMPLANT
SUT FIBERWIRE #2 38 REV NDL BL (SUTURE) ×6
SUT MNCRL AB 4-0 PS2 18 (SUTURE) ×3 IMPLANT
SUT MON AB 2-0 CT1 36 (SUTURE) ×5 IMPLANT
SUT VIC AB 1 CT1 27 (SUTURE) ×6
SUT VIC AB 1 CT1 27XBRD ANBCTR (SUTURE) ×1 IMPLANT
SUTURE FIBERWR#2 38 REV NDL BL (SUTURE) ×2 IMPLANT
TOWEL OR 17X24 6PK STRL BLUE (TOWEL DISPOSABLE) ×3 IMPLANT
TOWEL OR 17X26 10 PK STRL BLUE (TOWEL DISPOSABLE) ×3 IMPLANT
TRAY FOLEY CATH SILVER 14FR (SET/KITS/TRAYS/PACK) IMPLANT

## 2017-05-01 NOTE — Op Note (Signed)
04/30/2017 - 05/01/2017  1:51 PM  PATIENT:  Jeanette Montoya   MRN: [id number]  PRE-OPERATIVE DIAGNOSIS:  femoral neck fx  POST-OPERATIVE DIAGNOSIS:  femoral neck fx  PROCEDURE:  Procedure(s): ARTHROPLASTY BIPOLAR HIP (HEMIARTHROPLASTY)  PREOPERATIVE INDICATIONS:  Jeanette Montoya is an 83 y.o. female who was admitted 04/30/2017 with a diagnosis of Left displaced femoral neck fracture ([place]) and elected for surgical management.  The risks benefits and alternatives were discussed with the patient including but not limited to the risks of nonoperative treatment, versus surgical intervention including infection, bleeding, nerve injury, periprosthetic fracture, the need for revision surgery, dislocation, leg length discrepancy, blood clots, cardiopulmonary complications, morbidity, mortality, among others, and they were willing to proceed.  Predicted outcome is good, although there will be at least a six to nine month expected recovery.   OPERATIVE REPORT     SURGEON:  Edmonia Lynch, MD    ASSISTANT:  Silvestre Gunner, PA-C, he was present and scrubbed throughout the case, critical for completion in a timely fashion, and for retraction, instrumentation, and closure.     ANESTHESIA:  General    COMPLICATIONS:  None.      COMPONENTS:  [employer] Acol[employer]: Femoral stem: 4.5, Femoral Head:48, Neck:-4   PROCEDURE IN DETAIL: The patient was met in the holding area and identified.  The appropriate hip  was marked at the operative site. The patient was then transported to the OR and  placed under general anesthesia.  At that point, the patient was  placed in the lateral decubitus position with the operative side up and  secured to the operating room table and all bony prominences padded.     The operative lower extremity was prepped from the iliac crest to the toes.  Sterile draping was performed.  Time out was performed prior to incision.      A routine posterolateral approach was utilized via  sharp dissection  carried down to the subcutaneous tissue.  Gross bleeders were Bovie  coagulated.  The iliotibial band was identified and incised  along the length of the skin incision.  Self-retaining retractors were  inserted. I examined the bursa there was significant hematoma and edema I performed a bursectomy here.  With the hip internally rotated, the short external rotators  were identified. The piriformis was tagged with FiberWire, and the hip capsule released in a T-type fashion.  The femoral neck was exposed, and I resected the femoral neck using the appropriate jig. This was performed at approximately a thumb's breadth above the lesser trochanter.    I then exposed the deep acetabulum, cleared out any tissue including the ligamentum teres, and included the hip capsule in the FiberWire used above and below the T.    I then prepared the proximal femur using the cookie-cutter, the lateralizing reamer, and then sequentially broached.  A trial utilized, and I reduced the hip and it was found to have excellent stability with functional range of motion. The trial components were then removed.   The canal and acetabulum were thoroughly irrigated  I inserted the pressfit stem and placed the head and neck collar. The hip was reduced with appropriate force and was stable through a range of motion.   I then used a 2 mm drill bits to pass the FiberWire suture from the capsule and puriform is through the greater trochanter, and secured this. Excellent posterior capsular repair was achieved. I also closed the T in the capsule.  I then irrigated the hip copiously again  with pulse lavage, and repaired the fascia with Vicryl, followed by Vicryl for the subcutaneous tissue, Monocryl for the skin, Steri-Strips and sterile gauze. The wounds were injected. The patient was then awakened and returned to PACU in stable and satisfactory condition. There were no complications.  POST-OP PLAN: Weight bearing as  tolerated. DVT px will consist of SCD's and chemical px  Edmonia Lynch, MD Orthopedic Surgeon [phone]   05/01/2017 1:51 PM

## 2017-05-01 NOTE — Transfer of Care (Signed)
Immediate Anesthesia Transfer of Care Note  Patient: Jeanette Montoya  Procedure(s) Performed: Procedure(s): ARTHROPLASTY BIPOLAR HIP (HEMIARTHROPLASTY) (Left)  Patient Location: PACU  Anesthesia Type:General  Level of Consciousness: awake and alert   Airway & Oxygen Therapy: Patient Spontanous Breathing and Patient connected to nasal cannula oxygen  Post-op Assessment: Report given to RN and Post -op Vital signs reviewed and stable  Post vital signs: Reviewed and stable  Last Vitals:  Vitals:   04/30/17 2130 05/01/17 0444  BP: 136/61 (!) 130/54  Pulse: 72 65  Resp:  16  Temp: 36.6 C 37.1 C    Last Pain:  Vitals:   05/01/17 0444  TempSrc: Axillary  PainSc:          Complications: No apparent anesthesia complications

## 2017-05-01 NOTE — Anesthesia Preprocedure Evaluation (Addendum)
Anesthesia Evaluation  Patient identified by MRN, date of birth, ID band Patient awake    Reviewed: Allergy & Precautions, NPO status , Patient's Chart, lab work & pertinent test results  Airway Mallampati: II  TM Distance: >3 FB     Dental  (+) Edentulous Upper, Edentulous Lower   Pulmonary     + decreased breath sounds      Cardiovascular hypertension,  Rhythm:Regular Rate:Normal     Neuro/Psych    GI/Hepatic   Endo/Other    Renal/GU      Musculoskeletal   Abdominal   Peds  Hematology  (+) anemia ,   Anesthesia Other Findings   Reproductive/Obstetrics                           Anesthesia Physical Anesthesia Plan  ASA: III  Anesthesia Plan: General   Post-op Pain Management:    Induction: Intravenous  Airway Management Planned: Oral ETT  Additional Equipment:   Intra-op Plan:   Post-operative Plan: Extubation in OR  Informed Consent: I have reviewed the patients History and Physical, chart, labs and discussed the procedure including the risks, benefits and alternatives for the proposed anesthesia with the patient or authorized representative who has indicated his/her understanding and acceptance.   Dental Advisory Given  Plan Discussed with: CRNA and Anesthesiologist  Anesthesia Plan Comments:        Anesthesia Quick Evaluation

## 2017-05-01 NOTE — Interval H&P Note (Signed)
History and Physical Interval Note:  05/01/2017 8:28 AM  [name]  has presented today for surgery, with the diagnosis of femoral neck fx  The various methods of treatment have been discussed with the patient and family. After consideration of risks, benefits and other options for treatment, the patient has consented to  Procedure(s): ARTHROPLASTY BIPOLAR HIP (HEMIARTHROPLASTY) (Left) as a surgical intervention .  The patient's history has been reviewed, patient examined, no change in status, stable for surgery.  I have reviewed the patient's chart and labs.  Questions were answered to the patient's satisfaction.     Sagrario Lineberry D

## 2017-05-01 NOTE — Anesthesia Procedure Notes (Signed)
Procedure Name: Intubation Date/Time: 05/01/2017 12:45 PM Performed by: Neldon Newport Pre-anesthesia Checklist: Timeout performed, Patient being monitored, Suction available, Emergency Drugs available and Patient identified Patient Re-evaluated:Patient Re-evaluated prior to inductionOxygen Delivery Method: Circle system utilized Preoxygenation: Pre-oxygenation with 100% oxygen Intubation Type: IV induction Ventilation: Mask ventilation without difficulty Laryngoscope Size: Mac and 3 Grade View: Grade I Tube type: Oral Tube size: 7.0 mm Number of attempts: 1 Placement Confirmation: breath sounds checked- equal and bilateral,  positive ETCO2 and ETT inserted through vocal cords under direct vision Secured at: 20 cm Tube secured with: Tape Dental Injury: Teeth and Oropharynx as per pre-operative assessment

## 2017-05-01 NOTE — Progress Notes (Signed)
Echocardiogram 2D Echocardiogram has been performed.  Jeanette PartridgeBrooke S Khristopher Montoya 05/01/2017, 10:01 AM

## 2017-05-01 NOTE — H&P (View-Only) (Signed)
ORTHOPAEDIC CONSULTATION  REQUESTING PHYSICIAN: Elwin Mocha, MD  Chief Complaint: left hip fracture  HPI: Jeanette Montoya is a 83 y.o. female who complains of  A mechanical fall. She is nonverbal  Past Medical History:  Diagnosis Date  . Allergic conjunctivitis   . ALLERGIC RHINITIS   . Asthma   . Dementia   . Glaucoma   . Hypertension   . Mental disorder    Past Surgical History:  Procedure Laterality Date  . dental extraction    . GLAUCOMA SURGERY    . TOTAL ABDOMINAL HYSTERECTOMY W/ BILATERAL SALPINGOOPHORECTOMY     Social History   Social History  . Marital status: Widowed    Spouse name: [name]   . Number of children: 2  . Years of education: 12   Occupational History  . previously worked in [employer]; retired Retired   Social History Main Topics  . Smoking status: Never Smoker  . Smokeless tobacco: Never Used  . Alcohol use No  . Drug use: No  . Sexual activity: Not Asked   Other Topics Concern  . None   Social History Narrative   Patient lives at home with son [name].    Patient has 2 children.    Patient is retired.    Patient is right handed.    Patient has a high school education.         Family History  Problem Relation Age of Onset  . Heart attack Father   . Heart attack Brother   . Heart attack Brother    Allergies  Allergen Reactions  . Penicillins Other (See Comments)    REACTION: fainting spells per pt swelling   Prior to Admission medications   Medication Sig Start Date End Date Taking? Authorizing Provider  aspirin 325 MG EC tablet Take 325 mg by mouth daily.   Yes [provider]  betaxolol (BETOPTIC-S) 0.5 % ophthalmic suspension Place 1 drop into both eyes 2 (two) times daily.  09/22/14  Yes [provider]  brimonidine (ALPHAGAN P) 0.1 % SOLN Place 1 drop into both eyes 2 (two) times daily.  06/25/12  Yes [provider]  donepezil (ARICEPT) 10 MG tablet Take 1 tablet (10 mg  total) by mouth daily. 04/26/17  Yes Dennie Bible, NP  dorzolamide (TRUSOPT) 2 % ophthalmic solution Place 1 drop into both eyes 2 (two) times daily.  09/22/14  Yes [provider]  ezetimibe-simvastatin (VYTORIN) 10-20 MG per tablet Take 1 tablet by mouth at bedtime.     Yes [provider]  hydrochlorothiazide 25 MG tablet Take 25 mg by mouth daily.     Yes [provider]  LUMIGAN 0.01 % SOLN Place 1 drop into both eyes at bedtime. 03/13/17  Yes [provider]  memantine (NAMENDA) 10 MG tablet Take 1 tablet (10 mg total) by mouth 2 (two) times daily. 04/26/17  Yes Dennie Bible, NP  traMADol (ULTRAM) 50 MG tablet Take 50 mg by mouth daily as needed for moderate pain.  04/13/16  Yes [provider]  nitroGLYCERIN (NITROSTAT) 0.4 MG SL tablet Place 0.4 mg under the tongue every 5 (five) minutes as needed for chest pain.     [provider]   Dg Chest 1 View  Result Date: 04/30/2017 CLINICAL DATA:  Left hip fracture. EXAM: CHEST 1 VIEW COMPARISON:  July 12, 2011 FINDINGS: The heart size and mediastinal contours are within normal limits. Both lungs are clear.  The visualized skeletal structures are unremarkable. IMPRESSION: No active disease. Electronically Signed   By: David  Williams III M.D   On: 04/30/2017 08:15   Ct Head Wo Contrast  Result Date: 04/30/2017 CLINICAL DATA:  Unwitnessed fall.  Dementia EXAM: CT HEAD WITHOUT CONTRAST CT CERVICAL SPINE WITHOUT CONTRAST TECHNIQUE: Multidetector CT imaging of the head and cervical spine was performed following the standard protocol without intravenous contrast. Multiplanar CT image reconstructions of the cervical spine were also generated. COMPARISON:  MRI head 11/02/2012 FINDINGS: CT HEAD FINDINGS Brain: Moderate atrophy. Chronic white matter hypodensity bilaterally. Negative for acute infarct, hemorrhage, or mass lesion. Vascular: Negative for hyperdense vessel. Skull: Negative for skull  fracture. Sinuses/Orbits: Negative.  Bilateral cataract removal. Other: None CT CERVICAL SPINE FINDINGS Alignment: Mild anterolisthesis C5-6 and C7-T1 Skull base and vertebrae: Negative for fracture Soft tissues and spinal canal: Left thyroid nodule with heterogeneous enhancement and coarse calcification. The nodule measures 3.1 x 2.4 cm. Neoplasm not excluded although this is most likely benign. Disc levels: Facet joint effusion bilaterally at C2-3. Facet degeneration most prominent on the right at C3-4 and C4-5. Minimal disc space narrowing. Upper chest: Negative Other: None IMPRESSION: No acute intracranial abnormality Negative for cervical spine fracture 3.1 x 2.4 cm left thyroid nodule with coarse calcification. This may represent benign or malignant nodule. Electronically Signed   By: Charles  Clark M.D.   On: 04/30/2017 08:42   Ct Cervical Spine Wo Contrast  Result Date: 04/30/2017 CLINICAL DATA:  Unwitnessed fall.  Dementia EXAM: CT HEAD WITHOUT CONTRAST CT CERVICAL SPINE WITHOUT CONTRAST TECHNIQUE: Multidetector CT imaging of the head and cervical spine was performed following the standard protocol without intravenous contrast. Multiplanar CT image reconstructions of the cervical spine were also generated. COMPARISON:  MRI head 11/02/2012 FINDINGS: CT HEAD FINDINGS Brain: Moderate atrophy. Chronic white matter hypodensity bilaterally. Negative for acute infarct, hemorrhage, or mass lesion. Vascular: Negative for hyperdense vessel. Skull: Negative for skull fracture. Sinuses/Orbits: Negative.  Bilateral cataract removal. Other: None CT CERVICAL SPINE FINDINGS Alignment: Mild anterolisthesis C5-6 and C7-T1 Skull base and vertebrae: Negative for fracture Soft tissues and spinal canal: Left thyroid nodule with heterogeneous enhancement and coarse calcification. The nodule measures 3.1 x 2.4 cm. Neoplasm not excluded although this is most likely benign. Disc levels: Facet joint effusion bilaterally at C2-3.  Facet degeneration most prominent on the right at C3-4 and C4-5. Minimal disc space narrowing. Upper chest: Negative Other: None IMPRESSION: No acute intracranial abnormality Negative for cervical spine fracture 3.1 x 2.4 cm left thyroid nodule with coarse calcification. This may represent benign or malignant nodule. Electronically Signed   By: Charles  Clark M.D.   On: 04/30/2017 08:42   Dg Hip Unilat With Pelvis 2-3 Views Left  Result Date: 04/30/2017 CLINICAL DATA:  Pain after fall. EXAM: DG HIP (WITH OR WITHOUT PELVIS) 2-3V LEFT COMPARISON:  None. FINDINGS: A left hip fracture is identified. No dislocation. The right hip and pelvic bones are otherwise grossly intact. IMPRESSION: Left hip fracture without dislocation. Electronically Signed   By: David  Williams III M.D   On: 04/30/2017 08:14    Positive ROS: All other systems have been reviewed and were otherwise negative with the exception of those mentioned in the HPI and as above.  Labs cbc  Recent Labs  04/30/17 0725  WBC 6.3  HGB 11.6*  HCT 34.4*  PLT 122*    Labs inflam No results for input(s): CRP in the last 72 hours.  Invalid input(s): ESR    Labs coag  Recent Labs  04/30/17 0725  INR 1.15     Recent Labs  04/30/17 0725  NA 142  K 3.1*  CL 103  CO2 26  GLUCOSE 94  BUN 17  CREATININE 1.71*  CALCIUM 10.0    Physical Exam: Vitals:   04/30/17 1000 04/30/17 1015  BP: 125/64 127/64  Pulse: 77 82  Resp: 16 18  Temp:     General: Alert, no acute distress Cardiovascular: No pedal edema Respiratory: No cyanosis, no use of accessory musculature GI: No organomegaly, abdomen is soft and non-tender Skin: No lesions in the area of chief complaint other than those listed below in MSK exam.  Neurologic: Sensation intact distally save for the below mentioned MSK exam Psychiatric: Patient is demented Lymphatic: No axillary or cervical lymphadenopathy  MUSCULOSKELETAL:  LLE: pain with ROM, some spontaneous  movement noted, 2+ pulses Other extremities are atraumatic with painless ROM and NVI.  Assessment: Left femoral neck fracture  Plan: Plan for OR Monday for L hip hemiarthroplasty Hold anticoagulation Monday AM.    Jerrik Housholder D, MD Cell [phone]   04/30/2017 10:39 AM    

## 2017-05-01 NOTE — Progress Notes (Addendum)
[facility]                                                                              Patient Demographics  Jeanette Montoya, is a 83 y.o. female, DOB - 26-Aug-1922, [id number]  Admit date - 04/30/2017   Admitting Physician Haydee SalterPhillip M Hobbs, MD  Outpatient Primary MD for the patient is Blair HeysEhinger, Robert, MD  Outpatient specialists:   LOS - 1  days    Chief Complaint  Patient presents with  . Fall       Brief summary   Ane Paymentlizabeth M Montoya is a 83 y.o. female with medical history significant for advanced dementia followed by neurology in the outpatient setting, dyslipidemia, hypertension, asthma, glaucoma who was brought to the ER by EMS after unwitnessed fall at home with associated left leg pain. X-ray in the ER revealed left femoral neck fracture. CT of the head and neck showed no acute traumatic injuries but there was an incidental finding of a left thyroid nodule concerning for malignancy. Orthopedics was consulted. Also noticed troponin initial 0.17.   Assessment & Plan    Principal Problem:   Left displaced femoral neck fracture (HCC) - Presented after unwitnessed mechanical fall versus syncopal episode resulting in left femoral neck fracture - NPO currently for possible orthopedic surgical intervention today - Overnight elevated troponins although patient has no complaints of chest pain or shortness of breath - 2-D echo showed EF of 65-70% with normal wall motion, no regional wall motion abnormalities.    Active Problems:   Thrombocytopenia (HCC) - Unclear etiology, platelets down today - Anemia panel reviewed, anemia of chronic disease  HTN (hypertension) -Currently controlled -Was on HCTZ prior to admission-appears dry-see below regarding concerns with dementia-hold diuretic    Asthma -Not actively wheezing and was not on MDI prior to admission    Left thyroid nodule -Incidental finding on CT at time of admission -TSH normal, left  thyroid nodule 3.1X 2.4 cm with a coarse calcification on the CT head, will obtain thyroid ultrasound prior to DC or if family wants to pursue further management    Dementia/FTT/moderate-severe protein calorie malnutrition -Continue Namenda and Aricept -Patient's weight has decreased nearly 20 pounds since this time last year (115 lbs >> 99 lbs) -Nutrition consultation - UA negative for UTI    HLD (hyperlipidemia) -Continue preadmission Vytorin    Glaucoma -Continue Alphagan, beta optic, Trusopt, and Lumigan    CKD (chronic kidney disease), stage III - Follow creatinine function closely, currently at baseline  Code Status: Full CODE STATUS DVT Prophylaxis:  Currently chemical prophylaxis on hold Family Communication: Discussed in detail with the patient, all imaging results, lab results explained to the patient no family member at bedside   Disposition Plan:  Time Spent in minutes  25 minutes  Procedures:    Consultants:   Orthopedics  Antimicrobials:   Vancomycin perioperatively   Medications  Scheduled Meds: . acetaminophen  1,000 mg Oral Once  . aspirin  325 mg Oral Daily  . betaxolol  1 drop Both Eyes BID  . brimonidine  1 drop Both Eyes TID  . docusate sodium  100 mg  Oral BID  . donepezil  10 mg Oral Daily  . dorzolamide  1 drop Both Eyes BID  . enoxaparin (LOVENOX) injection  30 mg Subcutaneous Q24H  . ezetimibe-simvastatin  1 tablet Oral QHS  . feeding supplement  1 Container Oral TID BM  . latanoprost  1 drop Both Eyes QHS  . memantine  10 mg Oral BID   Continuous Infusions: . dextrose 5 % and 0.9 % NaCl with KCl 20 mEq/L 1 mL (04/30/17 2209)  . lactated ringers 1 mL (05/01/17 0601)  . methocarbamol (ROBAXIN)  IV    . vancomycin     PRN Meds:.hydrALAZINE, HYDROcodone-acetaminophen, methocarbamol **OR** methocarbamol (ROBAXIN)  IV, morphine injection, morphine injection, polyethylene glycol   Antibiotics   Anti-infectives    Start      Dose/Rate Route Frequency Ordered Stop   05/01/17 0600  ceFAZolin (ANCEF) IVPB 2g/100 mL premix  Status:  Discontinued     2 g 200 mL/hr over 30 Minutes Intravenous On call to O.R. 04/30/17 1610 04/30/17 1617   04/30/17 1630  vancomycin (VANCOCIN) IVPB 1000 mg/200 mL premix     1,000 mg 200 mL/hr over 60 Minutes Intravenous To ShortStay Surgical 04/30/17 1617 05/01/17 1630        Subjective:   [name] was seen and examined today.States left leg is hurting, difficult to obtain review of systems from the patient due to dementia. No acute events. Denies any chest pain or shortness of breath. No fevers or chills. No acute events overnight.    Objective:   Vitals:   04/30/17 1015 04/30/17 1100 04/30/17 2130 05/01/17 0444  BP: 127/64 127/62 136/61 (!) 130/54  Pulse: 82 83 72 65  Resp: 18 18  16   Temp:  98.3 F (36.8 C) 97.8 F (36.6 C) 98.7 F (37.1 C)  TempSrc:  Axillary Axillary Axillary  SpO2: 93% 93% 97% 96%    Intake/Output Summary (Last 24 hours) at 05/01/17 1053 Last data filed at 05/01/17 0517  Gross per 24 hour  Intake            717.5 ml  Output              250 ml  Net            467.5 ml     Wt Readings from Last 3 Encounters:  04/26/17 45.1 kg (99 lb 6.4 oz)  04/26/16 51.8 kg (114 lb 3.2 oz)  10/23/15 52.2 kg (115 lb)     Exam  General: Alert and awake, oriented to herself, NAD  HEENT:  PERRLA, EOMI, Anicteric Sclera, mucous membranes moist.   Neck: Supple, no JVD, no masses  Cardiovascular: S1 S2 auscultated, no rubs, murmurs or gallops. Regular rate and rhythm.  Respiratory: Clear to auscultation bilaterally, no wheezing, rales or rhonchi  Gastrointestinal: Soft, nontender, nondistended, + bowel sounds  Ext: no cyanosis clubbing or edema  Neuro: no new deficits  Skin: No rashes  Psych: flat affect   Data Reviewed:  I have personally reviewed following labs and imaging studies  Micro Results Recent Results (from the past 240  hour(s))  Surgical pcr screen     Status: None   Collection Time: 04/30/17  8:04 PM  Result Value Ref Range Status   MRSA, PCR NEGATIVE NEGATIVE Final   Staphylococcus aureus NEGATIVE NEGATIVE Final    Comment:        The Xpert SA Assay (FDA approved for NASAL specimens in patients over 28 years of age), is  one component of a comprehensive surveillance program.  Test performance has been validated by [facility] for patients greater than or equal to 5 year old. It is not intended to diagnose infection nor to guide or monitor treatment.     Radiology Reports Dg Chest 1 View  Result Date: 04/30/2017 CLINICAL DATA:  Left hip fracture. EXAM: CHEST 1 VIEW COMPARISON:  July 12, 2011 FINDINGS: The heart size and mediastinal contours are within normal limits. Both lungs are clear. The visualized skeletal structures are unremarkable. IMPRESSION: No active disease. Electronically Signed   By: Gerome Sam III M.D   On: 04/30/2017 08:15   Ct Head Wo Contrast  Result Date: 04/30/2017 CLINICAL DATA:  Unwitnessed fall.  Dementia EXAM: CT HEAD WITHOUT CONTRAST CT CERVICAL SPINE WITHOUT CONTRAST TECHNIQUE: Multidetector CT imaging of the head and cervical spine was performed following the standard protocol without intravenous contrast. Multiplanar CT image reconstructions of the cervical spine were also generated. COMPARISON:  MRI head 11/02/2012 FINDINGS: CT HEAD FINDINGS Brain: Moderate atrophy. Chronic white matter hypodensity bilaterally. Negative for acute infarct, hemorrhage, or mass lesion. Vascular: Negative for hyperdense vessel. Skull: Negative for skull fracture. Sinuses/Orbits: Negative.  Bilateral cataract removal. Other: None CT CERVICAL SPINE FINDINGS Alignment: Mild anterolisthesis C5-6 and C7-T1 Skull base and vertebrae: Negative for fracture Soft tissues and spinal canal: Left thyroid nodule with heterogeneous enhancement and coarse calcification. The nodule measures 3.1 x 2.4 cm.  Neoplasm not excluded although this is most likely benign. Disc levels: Facet joint effusion bilaterally at C2-3. Facet degeneration most prominent on the right at C3-4 and C4-5. Minimal disc space narrowing. Upper chest: Negative Other: None IMPRESSION: No acute intracranial abnormality Negative for cervical spine fracture 3.1 x 2.4 cm left thyroid nodule with coarse calcification. This may represent benign or malignant nodule. Electronically Signed   By: Marlan Palau M.D.   On: 04/30/2017 08:42   Ct Cervical Spine Wo Contrast  Result Date: 04/30/2017 CLINICAL DATA:  Unwitnessed fall.  Dementia EXAM: CT HEAD WITHOUT CONTRAST CT CERVICAL SPINE WITHOUT CONTRAST TECHNIQUE: Multidetector CT imaging of the head and cervical spine was performed following the standard protocol without intravenous contrast. Multiplanar CT image reconstructions of the cervical spine were also generated. COMPARISON:  MRI head 11/02/2012 FINDINGS: CT HEAD FINDINGS Brain: Moderate atrophy. Chronic white matter hypodensity bilaterally. Negative for acute infarct, hemorrhage, or mass lesion. Vascular: Negative for hyperdense vessel. Skull: Negative for skull fracture. Sinuses/Orbits: Negative.  Bilateral cataract removal. Other: None CT CERVICAL SPINE FINDINGS Alignment: Mild anterolisthesis C5-6 and C7-T1 Skull base and vertebrae: Negative for fracture Soft tissues and spinal canal: Left thyroid nodule with heterogeneous enhancement and coarse calcification. The nodule measures 3.1 x 2.4 cm. Neoplasm not excluded although this is most likely benign. Disc levels: Facet joint effusion bilaterally at C2-3. Facet degeneration most prominent on the right at C3-4 and C4-5. Minimal disc space narrowing. Upper chest: Negative Other: None IMPRESSION: No acute intracranial abnormality Negative for cervical spine fracture 3.1 x 2.4 cm left thyroid nodule with coarse calcification. This may represent benign or malignant nodule. Electronically Signed    By: Marlan Palau M.D.   On: 04/30/2017 08:42   Dg Hip Unilat With Pelvis 2-3 Views Left  Result Date: 04/30/2017 CLINICAL DATA:  Pain after fall. EXAM: DG HIP (WITH OR WITHOUT PELVIS) 2-3V LEFT COMPARISON:  None. FINDINGS: A left hip fracture is identified. No dislocation. The right hip and pelvic bones are otherwise grossly intact. IMPRESSION: Left hip fracture without dislocation.  Electronically Signed   By: Gerome Sam III M.D   On: 04/30/2017 08:14   Dg Hip Unilat With Pelvis 2-3 Views Right  Result Date: 04/18/2017 CLINICAL DATA:  Right hip pain. The patient is uncertain if she suffered a right hip injury. Initial encounter. EXAM: DG HIP (WITH OR WITHOUT PELVIS) 2-3V RIGHT COMPARISON:  None. FINDINGS: No acute bony or joint abnormality is identified. No notable degenerative change about the hips. Lower lumbar spondylosis is seen. No focal bony lesion. Soft tissues unremarkable. IMPRESSION: No acute abnormality. Lower lumbar degenerative disease. Electronically Signed   By: Drusilla Kanner M.D.   On: 04/18/2017 11:07    Lab Data:  CBC:  Recent Labs Lab 04/30/17 0725 05/01/17 0544  WBC 6.3 5.0  NEUTROABS 5.1  --   HGB 11.6* 11.2*  HCT 34.4* 33.1*  MCV 99.1 99.4  PLT 122* 97*   Basic Metabolic Panel:  Recent Labs Lab 04/30/17 0725 04/30/17 1132 05/01/17 0544  NA 142  --  144  K 3.1*  --  3.6  CL 103  --  106  CO2 26  --  24  GLUCOSE 94  --  112*  BUN 17  --  17  CREATININE 1.71*  --  1.18*  CALCIUM 10.0  --  9.0  MG  --  2.2  --    GFR: Estimated Creatinine Clearance: 21.7 mL/min (A) (by C-G formula based on SCr of 1.18 mg/dL (H)). Liver Function Tests: No results for input(s): AST, ALT, ALKPHOS, BILITOT, PROT, ALBUMIN in the last 168 hours. No results for input(s): LIPASE, AMYLASE in the last 168 hours. No results for input(s): AMMONIA in the last 168 hours. Coagulation Profile:  Recent Labs Lab 04/30/17 0725  INR 1.15   Cardiac Enzymes:  Recent  Labs Lab 04/30/17 1132 04/30/17 1838 05/01/17 0000 05/01/17 0544  CKTOTAL 98  --   --   --   TROPONINI 0.17* 0.18* 0.18* 0.13*   BNP (last 3 results) No results for input(s): PROBNP in the last 8760 hours. HbA1C: No results for input(s): HGBA1C in the last 72 hours. CBG:  Recent Labs Lab 04/30/17 2023  GLUCAP 87   Lipid Profile: No results for input(s): CHOL, HDL, LDLCALC, TRIG, CHOLHDL, LDLDIRECT in the last 72 hours. Thyroid Function Tests:  Recent Labs  04/30/17 1345  TSH 2.513   Anemia Panel:  Recent Labs  04/30/17 1132  VITAMINB12 1,380*  FOLATE 48.7  FERRITIN 101  TIBC 252  IRON 95  RETICCTPCT 2.4   Urine analysis:    Component Value Date/Time   COLORURINE AMBER (A) 04/30/2017 2009   APPEARANCEUR CLEAR 04/30/2017 2009   LABSPEC 1.021 04/30/2017 2009   PHURINE 5.0 04/30/2017 2009   GLUCOSEU NEGATIVE 04/30/2017 2009   HGBUR NEGATIVE 04/30/2017 2009   BILIRUBINUR NEGATIVE 04/30/2017 2009   KETONESUR 20 (A) 04/30/2017 2009   PROTEINUR NEGATIVE 04/30/2017 2009   NITRITE NEGATIVE 04/30/2017 2009   LEUKOCYTESUR NEGATIVE 04/30/2017 2009     Ripudeep Rai M.D. [facility] 05/01/2017, 10:53 AM  Pager: [phone] Between 7am to 7pm - call Pager - [phone]  After 7pm go to [email or web address] - password [email or web address]  Call night coverage person covering after 7pm

## 2017-05-01 NOTE — Progress Notes (Signed)
Initial Nutrition Assessment  DOCUMENTATION CODES:   Severe malnutrition in context of chronic illness  INTERVENTION:  Discontinue Boost Breeze.  Once diet advances, provides Ensure Enlive po BID, each supplement provides 350 kcal and 20 grams of protein.  NUTRITION DIAGNOSIS:   Malnutrition (severe) related to chronic illness (advanced dementia) as evidenced by severe depletion of body fat, severe depletion of muscle mass.  GOAL:   Patient will meet greater than or equal to 90% of their needs  MONITOR:   Supplement acceptance, Diet advancement, Labs, Weight trends, Skin, I & O's  REASON FOR ASSESSMENT:   Consult Assessment of nutrition requirement/status  ASSESSMENT:   83 y.o. female with medical history significant for advanced dementia followed by neurology in the outpatient setting, dyslipidemia, hypertension, asthma, glaucoma who was brought to the ER by EMS after unwitnessed fall at home with associated left leg pain. X-ray in the ER revealed left femoral neck fracture. CT of the head and neck showed no acute traumatic injuries but there was an incidental finding of a left thyroid nodule concerning for malignancy.  Pt is currently NPO for surgery today. Pt nonverbal. Family at bedside. They reports pt's appetite has been "so so" PTA. Pt usually consumes 1 meal a day with a snack and an Ensure shake once daily. Family reports pt has had weight loss, with usual body weight of ~108 lbs where pt last weighed Apr 18, 2017. Pt with a 8.3% weight loss in 2 weeks. Pt currently has Boost Breeze ordered. RD to discontinue them and order Ensure instead.   Nutrition-Focused physical exam completed. Findings are severe fat depletion, severe muscle depletion, and no edema.   Labs and medications reviewed.   Diet Order:  Diet NPO time specified Except for: Sips with Meds  Skin:  Reviewed, no issues  Last BM:  5/12  Height:   Ht Readings from Last 1 Encounters:  04/26/17 5' (1.524  m)    Weight:   Wt Readings from Last 1 Encounters:  04/26/17 99 lb 6.4 oz (45.1 kg)    Ideal Body Weight:  45.45 kg  BMI:  There is no height or weight on file to calculate BMI.  Estimated Nutritional Needs:   Kcal:  1250-1450  Protein:  45-60 grams  Fluid:  >/= 1.5 L/day  EDUCATION NEEDS:   No education needs identified at this time  Roslyn SmilingStephanie Lene Mckay, MS, RD, LDN Pager # [phone] After hours/ weekend pager # [phone]

## 2017-05-01 NOTE — Consult Note (Addendum)
Cardiology Consult    Patient ID: [name] MRN: [id number], DOB/AGE: 07/11/1922   Admit date: 04/30/2017 Date of Consult: 05/01/2017  Primary Physician: Blair Heys, MD Primary Cardiologist: new - Dr. Elease Hashimoto Requesting Provider: Dr. Isidoro Donning  Reason for Consult: preoperative evaluation  Patient Profile    Jeanette Montoya is a 83 yo female with advanced dementia who is nonverbal; she also has HTN, asthma, HLD, CKD stage III-IV, and anemia and thrombocytopenia.  She suffered a mechanical fall and fractured her left femoral head. She is scheduled for L hip hemiarthroplasty today.  Jeanette Montoya is a 83 y.o. female who is being seen today for the evaluation of preoperative evaluation at the request of Dr. Isidoro Donning.   Past Medical History   Past Medical History:  Diagnosis Date  . Allergic conjunctivitis   . ALLERGIC RHINITIS   . Asthma   . Dementia   . Glaucoma   . Hypertension   . Mental disorder     Past Surgical History:  Procedure Laterality Date  . dental extraction    . GLAUCOMA SURGERY    . TOTAL ABDOMINAL HYSTERECTOMY W/ BILATERAL SALPINGOOPHORECTOMY       Allergies  Allergies  Allergen Reactions  . Penicillins Other (See Comments)    REACTION: fainting spells per pt swelling    History of Present Illness    Jeanette Montoya has PMH significant for HTN and HLD. She currenlty does not follow with our cardiology group and has no known CAD. She currently takes ASA and HCTZ. She was in her usual state of health, which includes being nonverbal, when she suffered a mechanical fall coming out of the bathroom at home and was found to have a left femoral head fracture. Orthopedics were consulted and she is scheduled to have left hip surgery today. Cardiology was asked to evaluate for risk assessment.   Inpatient Medications    . acetaminophen  1,000 mg Oral Once  . aspirin  325 mg Oral Daily  . betaxolol  1 drop Both Eyes BID  . brimonidine  1 drop Both Eyes TID   . docusate sodium  100 mg Oral BID  . donepezil  10 mg Oral Daily  . dorzolamide  1 drop Both Eyes BID  . enoxaparin (LOVENOX) injection  30 mg Subcutaneous Q24H  . ezetimibe-simvastatin  1 tablet Oral QHS  . feeding supplement  1 Container Oral TID BM  . latanoprost  1 drop Both Eyes QHS  . memantine  10 mg Oral BID     Outpatient Medications    Prior to Admission medications   Medication Sig Start Date End Date Taking? Authorizing Provider  aspirin 325 MG EC tablet Take 325 mg by mouth daily.   Yes [provider]  betaxolol (BETOPTIC-S) 0.5 % ophthalmic suspension Place 1 drop into both eyes 2 (two) times daily.  09/22/14  Yes [provider]  brimonidine (ALPHAGAN P) 0.1 % SOLN Place 1 drop into both eyes 2 (two) times daily.  06/25/12  Yes [provider]  donepezil (ARICEPT) 10 MG tablet Take 1 tablet (10 mg total) by mouth daily. 04/26/17  Yes Nilda Riggs, NP  dorzolamide (TRUSOPT) 2 % ophthalmic solution Place 1 drop into both eyes 2 (two) times daily.  09/22/14  Yes [provider]  ezetimibe-simvastatin (VYTORIN) 10-20 MG per tablet Take 1 tablet by mouth at bedtime.     Yes [provider]  hydrochlorothiazide 25 MG tablet Take 25 mg by mouth  daily.     Yes [provider]  LUMIGAN 0.01 % SOLN Place 1 drop into both eyes at bedtime. 03/13/17  Yes [provider]  memantine (NAMENDA) 10 MG tablet Take 1 tablet (10 mg total) by mouth 2 (two) times daily. 04/26/17  Yes Nilda Riggs, NP  traMADol (ULTRAM) 50 MG tablet Take 50 mg by mouth daily as needed for moderate pain.  04/13/16  Yes [provider]  nitroGLYCERIN (NITROSTAT) 0.4 MG SL tablet Place 0.4 mg under the tongue every 5 (five) minutes as needed for chest pain.     [provider]     Family History    Family History  Problem Relation Age of Onset  . Heart attack Father   . Heart attack Brother   . Heart attack Brother      Social History    Social History   Social History  . Marital status: Widowed    Spouse name: Greggory Stallion   . Number of children: 2  . Years of education: 12   Occupational History  . previously worked in [employer]; retired Retired   Social History Main Topics  . Smoking status: Never Smoker  . Smokeless tobacco: Never Used  . Alcohol use No  . Drug use: No  . Sexual activity: Not on file   Other Topics Concern  . Not on file   Social History Narrative   Patient lives at home with son Greggory Stallion.    Patient has 2 children.    Patient is retired.    Patient is right handed.    Patient has a high school education.           Review of Systems    Review of Systems unable to be collected given her nonverbal status. She does not appear to be in distress or in pain.    Physical Exam    Blood pressure (!) 130/54, pulse 65, temperature 98.7 F (37.1 C), temperature source Axillary, resp. rate 16, SpO2 96 %.  General: NAD, nonverbal Psych: nonverbal  Neuro: Awake, unable to assess orientation. Moves upper extremities spontaneously. HEENT: Normal  Neck: Supple without bruits or no JVD. Lungs:  Resp regular and unlabored, CTA in upper lobes Heart: RRR, 2/6 systolic murmur . C/w a dynamic LVOT murmur.   Pulses are good.  Abdomen: Soft, non-tender, non-distended, BS + x 4.  Extremities: No clubbing, cyanosis or edema. DP/PT/Radials 1+ and equal bilaterally.  Labs    Troponin (Point of Care Test) No results for input(s): TROPIPOC in the last 72 hours.  Recent Labs  04/30/17 1132 04/30/17 1838 05/01/17 0000 05/01/17 0544  CKTOTAL 98  --   --   --   TROPONINI 0.17* 0.18* 0.18* 0.13*   Lab Results  Component Value Date   WBC 5.0 05/01/2017   HGB 11.2 (L) 05/01/2017   HCT 33.1 (L) 05/01/2017   MCV 99.4 05/01/2017   PLT 97 (L) 05/01/2017    Recent Labs Lab 05/01/17 0544  NA 144  K 3.6  CL 106  CO2 24  BUN 17  CREATININE 1.18*  CALCIUM 9.0  GLUCOSE 112*    No results found for: CHOL, HDL, LDLCALC, TRIG No results found for: [facility]   Radiology Studies    Dg Chest 1 View  Result Date: 04/30/2017 CLINICAL DATA:  Left hip fracture. EXAM: CHEST 1 VIEW COMPARISON:  July 12, 2011 FINDINGS: The heart size and mediastinal contours are within normal limits. Both lungs are clear.  The visualized skeletal structures are unremarkable. IMPRESSION: No active disease. Electronically Signed   By: Gerome Samavid  Williams III M.D   On: 04/30/2017 08:15   Ct Head Wo Contrast  Result Date: 04/30/2017 CLINICAL DATA:  Unwitnessed fall.  Dementia EXAM: CT HEAD WITHOUT CONTRAST CT CERVICAL SPINE WITHOUT CONTRAST TECHNIQUE: Multidetector CT imaging of the head and cervical spine was performed following the standard protocol without intravenous contrast. Multiplanar CT image reconstructions of the cervical spine were also generated. COMPARISON:  MRI head 11/02/2012 FINDINGS: CT HEAD FINDINGS Brain: Moderate atrophy. Chronic white matter hypodensity bilaterally. Negative for acute infarct, hemorrhage, or mass lesion. Vascular: Negative for hyperdense vessel. Skull: Negative for skull fracture. Sinuses/Orbits: Negative.  Bilateral cataract removal. Other: None CT CERVICAL SPINE FINDINGS Alignment: Mild anterolisthesis C5-6 and C7-T1 Skull base and vertebrae: Negative for fracture Soft tissues and spinal canal: Left thyroid nodule with heterogeneous enhancement and coarse calcification. The nodule measures 3.1 x 2.4 cm. Neoplasm not excluded although this is most likely benign. Disc levels: Facet joint effusion bilaterally at C2-3. Facet degeneration most prominent on the right at C3-4 and C4-5. Minimal disc space narrowing. Upper chest: Negative Other: None IMPRESSION: No acute intracranial abnormality Negative for cervical spine fracture 3.1 x 2.4 cm left thyroid nodule with coarse calcification. This may represent benign or malignant nodule. Electronically Signed   By: Marlan Palauharles  Clark M.D.    On: 04/30/2017 08:42   Ct Cervical Spine Wo Contrast  Result Date: 04/30/2017 CLINICAL DATA:  Unwitnessed fall.  Dementia EXAM: CT HEAD WITHOUT CONTRAST CT CERVICAL SPINE WITHOUT CONTRAST TECHNIQUE: Multidetector CT imaging of the head and cervical spine was performed following the standard protocol without intravenous contrast. Multiplanar CT image reconstructions of the cervical spine were also generated. COMPARISON:  MRI head 11/02/2012 FINDINGS: CT HEAD FINDINGS Brain: Moderate atrophy. Chronic white matter hypodensity bilaterally. Negative for acute infarct, hemorrhage, or mass lesion. Vascular: Negative for hyperdense vessel. Skull: Negative for skull fracture. Sinuses/Orbits: Negative.  Bilateral cataract removal. Other: None CT CERVICAL SPINE FINDINGS Alignment: Mild anterolisthesis C5-6 and C7-T1 Skull base and vertebrae: Negative for fracture Soft tissues and spinal canal: Left thyroid nodule with heterogeneous enhancement and coarse calcification. The nodule measures 3.1 x 2.4 cm. Neoplasm not excluded although this is most likely benign. Disc levels: Facet joint effusion bilaterally at C2-3. Facet degeneration most prominent on the right at C3-4 and C4-5. Minimal disc space narrowing. Upper chest: Negative Other: None IMPRESSION: No acute intracranial abnormality Negative for cervical spine fracture 3.1 x 2.4 cm left thyroid nodule with coarse calcification. This may represent benign or malignant nodule. Electronically Signed   By: Marlan Palauharles  Clark M.D.   On: 04/30/2017 08:42   Dg Hip Unilat With Pelvis 2-3 Views Left  Result Date: 04/30/2017 CLINICAL DATA:  Pain after fall. EXAM: DG HIP (WITH OR WITHOUT PELVIS) 2-3V LEFT COMPARISON:  None. FINDINGS: A left hip fracture is identified. No dislocation. The right hip and pelvic bones are otherwise grossly intact. IMPRESSION: Left hip fracture without dislocation. Electronically Signed   By: Gerome Samavid  Williams III M.D   On: 04/30/2017 08:14   Dg Hip  Unilat With Pelvis 2-3 Views Right  Result Date: 04/18/2017 CLINICAL DATA:  Right hip pain. The patient is uncertain if she suffered a right hip injury. Initial encounter. EXAM: DG HIP (WITH OR WITHOUT PELVIS) 2-3V RIGHT COMPARISON:  None. FINDINGS: No acute bony or joint abnormality is identified. No notable degenerative change about the hips. Lower lumbar spondylosis is seen. No focal  bony lesion. Soft tissues unremarkable. IMPRESSION: No acute abnormality. Lower lumbar degenerative disease. Electronically Signed   By: Drusilla Kanner M.D.   On: 04/18/2017 11:07    ECG & Cardiac Imaging    EKG 05/01/17: sinus rhythm  Echocardiogram 05/01/17:  Read pending  Assessment & Plan    Preoperative assessment for cardiac risk for hip surgery Jeanette Montoya is a 83 yo female scheduled for left hip hemiarthroplasty today, 04/29/17. Echocardiogram pending.   According to the Revised Cardiac Risk Index, she does not have known heart failure, ischemic heart disease, DM, and her preoperative sCr was below 2.0. Echocardiogram results pending. It is unknown if she is having chest pain given her non-verbal status. Per the family at bedside, she has no known cardiac problems including previous MI or heart failure. Given her age and advanced dementia, she is at least a moderate risk or this high risk procedure. She has a 2.4% chance of cardiac death, nonfatal myocardial infarction, and nonfatal cardiac arrest. She has a 3.6% chance of myocardial infarction, pulmonary edema, ventricular fibrillation, primary cardiac arrest, and complete heart block.    Signed, Marcelino Duster, PA-C 05/01/2017, 10:17 AM [phone]   Attending Note:   The patient was seen and examined.  Agree with assessment and plan as noted above.  Changes made to the above note as needed.  Patient seen and independently examined with Bettina Gavia, PA .   We discussed all aspects of the encounter. I agree with the assessment and plan as stated  above.  1. Pre-op assessment:   She has severe dementia so her history is uncertain No obvious pain  Hemodynamically she is stable  Has a heart murmur that is consistant with a dynamic LVOT obstruction  The AV is seen to open well - no aortic stenosis   At this point, I do not find any contraindications for her to have this hip repair surgery .   Without the surgery , she will be bedridden and her prognosis would be poor.   She is at moderate risk for CV complications with this surgery .    I have spent a total of 40 minutes with patient reviewing hospital  notes , telemetry, EKGs, labs and examining patient as well as establishing an assessment and plan that was discussed with the patient. > 50% of time was spent in direct patient care.  No additional recommendations. Call for questions   Alvia Grove., MD, [facility] 05/01/2017, 11:38 AM [address]  [address] Office [phone] Pager [phone]

## 2017-05-01 NOTE — Progress Notes (Signed)
I have reviewed and agreed above plan. 

## 2017-05-01 NOTE — Progress Notes (Signed)
Pt NPO midnight,  PCR negative, CHG bath done , no consent yet her son will come today to sign the consent for left hip hemiarthroplasty, foley care done, no  s/s of distress noted.

## 2017-05-01 NOTE — Anesthesia Postprocedure Evaluation (Signed)
Anesthesia Post Note  Patient: Ane Paymentlizabeth M Mchaffie  Procedure(s) Performed: Procedure(s) (LRB): ARTHROPLASTY BIPOLAR HIP (HEMIARTHROPLASTY) (Left)  Patient location during evaluation: PACU Anesthesia Type: General Level of consciousness: awake, awake and alert and oriented Pain management: pain level controlled Respiratory status: spontaneous breathing, nonlabored ventilation and respiratory function stable Cardiovascular status: blood pressure returned to baseline Anesthetic complications: no       Last Vitals:  Vitals:   05/01/17 1525 05/01/17 1550  BP: 127/61 124/60  Pulse: 87 99  Resp: 15   Temp: 36.2 C 36.4 C    Last Pain:  Vitals:   05/01/17 1550  TempSrc: Axillary  PainSc:                  Dreyton Roessner COKER

## 2017-05-02 ENCOUNTER — Encounter (HOSPITAL_COMMUNITY): Payer: Self-pay | Admitting: *Deleted

## 2017-05-02 DIAGNOSIS — Z9889 Other specified postprocedural states: Secondary | ICD-10-CM

## 2017-05-02 LAB — BASIC METABOLIC PANEL
Anion gap: 9 (ref 5–15)
BUN: 12 mg/dL (ref 6–20)
CHLORIDE: 110 mmol/L (ref 101–111)
CO2: 24 mmol/L (ref 22–32)
CREATININE: 1.08 mg/dL — AB (ref 0.44–1.00)
Calcium: 8.4 mg/dL — ABNORMAL LOW (ref 8.9–10.3)
GFR calc Af Amer: 50 mL/min — ABNORMAL LOW (ref >60)
GFR calc non Af Amer: 43 mL/min — ABNORMAL LOW (ref >60)
GLUCOSE: 169 mg/dL — AB (ref 65–99)
POTASSIUM: 3.5 mmol/L (ref 3.5–5.1)
SODIUM: 143 mmol/L (ref 135–145)

## 2017-05-02 LAB — URINE CULTURE: CULTURE: NO GROWTH

## 2017-05-02 LAB — CBC
HEMATOCRIT: 27.5 % — AB (ref 36.0–46.0)
HEMOGLOBIN: 9.1 g/dL — AB (ref 12.0–15.0)
MCH: 33 pg (ref 26.0–34.0)
MCHC: 33.1 g/dL (ref 30.0–36.0)
MCV: 99.6 fL (ref 78.0–100.0)
Platelets: 85 10*3/uL — ABNORMAL LOW (ref 150–400)
RBC: 2.76 MIL/uL — AB (ref 3.87–5.11)
RDW: 14.4 % (ref 11.5–15.5)
WBC: 5.8 10*3/uL (ref 4.0–10.5)

## 2017-05-02 MED ORDER — ASPIRIN 325 MG PO TABS
325.0000 mg | ORAL_TABLET | Freq: Every day | ORAL | Status: DC
  Administered 2017-05-04 – 2017-05-05 (×2): 325 mg via ORAL
  Filled 2017-05-02 (×4): qty 1

## 2017-05-02 MED ORDER — ACETAMINOPHEN 500 MG PO TABS
1000.0000 mg | ORAL_TABLET | Freq: Three times a day (TID) | ORAL | Status: AC
  Administered 2017-05-02: 1000 mg via ORAL
  Filled 2017-05-02 (×2): qty 2

## 2017-05-02 MED ORDER — HYDROCODONE-ACETAMINOPHEN 5-325 MG PO TABS: 1.0000 | ORAL_TABLET | Freq: Four times a day (QID) | ORAL | Status: DC | PRN

## 2017-05-02 NOTE — Evaluation (Signed)
Occupational Therapy Evaluation Patient Details Name: Jeanette Montoya MRN: [id number] DOB: [birth year] Today's Date: 05/02/2017    History of Present Illness Jeanette Montoya a 83 y.o.femalewho complains of A mechanical fall resulting in a left hip fx. s/p arthroplasty bipolar hip with posterior approach.   Clinical Impression   Per RN, pt lives with her son. PLOF unclean and pt unable to state. Pt requires Max A+2 for bed mobility, total A for transfers, and total for ADLs. Pt would benefit from skilled OT to increase occupational participation, safety with functional mobility, and family education. Recommend dc to SNF for OT to increase occupational performance and participation.     Follow Up Recommendations  SNF    Equipment Recommendations  Other (comment) (Defer to next venue)    Recommendations for Other Services PT consult     Precautions / Restrictions Precautions Precautions: Fall;Posterior Hip Precaution Booklet Issued: No Precaution Comments: Pt decreased cogntition. Will provided to family Required Braces or Orthoses: Other Brace/Splint (Abbduction pillow) Restrictions Weight Bearing Restrictions: Yes LLE Weight Bearing: Weight bearing as tolerated      Mobility Bed Mobility Overal bed mobility: Needs Assistance Bed Mobility: Supine to Sit     Supine to sit: Max assist;+2 for physical assistance     General bed mobility comments: Pt required Max A for BLE management from supine to EOB as well as trunk support when transition into seated position.   Transfers Overall transfer level: Needs assistance Equipment used: None Transfers: Squat Pivot Transfers     Squat pivot transfers: Total assist;+2 safety/equipment     General transfer comment: Pt required total A to squat pivot from EOB to recliner in preparation for dinner meal    Balance Overall balance assessment: History of Falls                                          ADL either performed or assessed with clinical judgement   ADL Overall ADL's : Needs assistance/impaired                                       General ADL Comments: Pt total A for ADL and fucntional mobility. Pt probably near baseline. No family to confirm PLOF.      Vision         Perception     Praxis      Pertinent Vitals/Pain Pain Assessment: Faces Faces Pain Scale: Hurts even more Pain Location: L hip with movement Pain Descriptors / Indicators: Grimacing;Moaning Pain Intervention(s): Limited activity within patient's tolerance;Monitored during session;Repositioned;Ice applied     Hand Dominance     Extremity/Trunk Assessment Upper Extremity Assessment Upper Extremity Assessment: Difficult to assess due to impaired cognition   Lower Extremity Assessment Lower Extremity Assessment: Difficult to assess due to impaired cognition       Communication Communication Communication: Other (comment)   Cognition Arousal/Alertness: Awake/alert Behavior During Therapy: Flat affect Overall Cognitive Status: History of cognitive impairments - at baseline                                 General Comments: Pt with dementia diagnosis PTA   General Comments  No family present to confirm PLOF and home set up.  Exercises     Shoulder Instructions      Home Living Family/patient expects to be discharged to:: Skilled nursing facility                                 Additional Comments: Per RN, pt lived with her son PTA.       Prior Functioning/Environment Level of Independence: Needs assistance        Comments: No family available to confirm functional level PTA. Expect pt was Max A for ADLs.        OT Problem List: Decreased cognition;Pain      OT Treatment/Interventions: Self-care/ADL training;Therapeutic exercise;Energy conservation;DME and/or AE instruction;Therapeutic activities;Patient/family education     OT Goals(Current goals can be found in the care plan section) Acute Rehab OT Goals Patient Stated Goal: Not stated OT Goal Formulation: Patient unable to participate in goal setting ADL Goals Pt Will Perform Eating: with mod assist;sitting Pt Will Perform Grooming: with mod assist;sitting  OT Frequency: Min 2X/week   Barriers to D/C:            Co-evaluation PT/OT/SLP Co-Evaluation/Treatment: Yes Reason for Co-Treatment: For patient/therapist safety;Necessary to address cognition/behavior during functional activity PT goals addressed during session: Mobility/safety with mobility;Balance OT goals addressed during session: ADL's and self-care      AM-PAC PT "6 Clicks" Daily Activity     Outcome Measure Help from another person eating meals?: Total Help from another person taking care of personal grooming?: Total Help from another person toileting, which includes using toliet, bedpan, or urinal?: Total Help from another person bathing (including washing, rinsing, drying)?: Total Help from another person to put on and taking off regular upper body clothing?: Total Help from another person to put on and taking off regular lower body clothing?: Total 6 Click Score: 6   End of Session Equipment Utilized During Treatment: Gait belt Nurse Communication: Mobility status;Precautions;Weight bearing status  Activity Tolerance: Patient tolerated treatment well (Limited participation due to decreased cogntition) Patient left: in chair;with call bell/phone within reach;with chair alarm set  OT Visit Diagnosis: History of falling (Z91.81);Pain;Adult, failure to thrive (R62.7);Other symptoms and signs involving cognitive function Pain - Right/Left: Left Pain - part of body: Hip                Time: 1610-9604 OT Time Calculation (min): 27 min Charges:  OT General Charges $OT Visit: 1 Procedure OT Evaluation $OT Eval Moderate Complexity: 1 Procedure G-Codes:     Jeanette Montoya,  OTR/L [phone]  Jeanette Montoya 05/02/2017, 5:48 PM

## 2017-05-02 NOTE — Progress Notes (Signed)
   Assessment: 1 Day Post-Op  S/P Procedure(s) (LRB): ARTHROPLASTY BIPOLAR HIP (HEMIARTHROPLASTY) (Left) by Dr. Jewel Baizeimothy D. Murphy on 05/01/17  Principal Problem:   Left displaced femoral neck fracture (HCC) Active Problems:   Thrombocytopenia (HCC)   Left thyroid nodule   HTN (hypertension)   Dementia   Asthma   Acute hypokalemia   HLD (hyperlipidemia)   Anemia   CKD (chronic kidney disease), stage III-IV   Protein-calorie malnutrition, severe  AFVSN Labs pending No recent administration of pain medications Stable from an orthopedic perspective.  Plan: Posterior hip precautions Up with therapy Incentive Spirometry Apply ice  Weight Bearing: Weight Bearing as Tolerated (WBAT).  Posterior hip precautions. Dressings: Clean dressing prn.  VTE prophylaxis: Lovenox, SCDs, ambulation.  Recommend Lovenox for 30 days post op. Dispo: Per primary.  Likely will need skilled nursing.  Subjective: Does not follow commands or respond to questions.  No family at bedside.  Objective:   VITALS:   Vitals:   05/01/17 1525 05/01/17 1550 05/01/17 2129 05/02/17 0622  BP: 127/61 124/60 (!) 109/50 100/80  Pulse: 87 99 84 87  Resp: 15  16 16   Temp: 97.2 F (36.2 C) 97.6 F (36.4 C) 98.1 F (36.7 C) 98.3 F (36.8 C)  TempSrc: Oral Axillary Axillary Axillary  SpO2: 100% 98% 97% 98%   CBC Latest Ref Rng & Units 05/01/2017 04/30/2017  WBC 4.0 - 10.5 K/uL 5.0 6.3  Hemoglobin 12.0 - 15.0 g/dL 11.2(L) 11.6(L)  Hematocrit 36.0 - 46.0 % 33.1(L) 34.4(L)  Platelets 150 - 400 K/uL 97(L) 122(L)   BMP Latest Ref Rng & Units 05/01/2017 04/30/2017  Glucose 65 - 99 mg/dL 657(Q112(H) 94  BUN 6 - 20 mg/dL 17 17  Creatinine 4.690.44 - 1.00 mg/dL 6.29(B1.18(H) 2.84(X1.71(H)  Sodium 135 - 145 mmol/L 144 142  Potassium 3.5 - 5.1 mmol/L 3.6 3.1(L)  Chloride 101 - 111 mmol/L 106 103  CO2 22 - 32 mmol/L 24 26  Calcium 8.9 - 10.3 mg/dL 9.0 32.410.0   Intake/Output      05/14 0701 - 05/15 0700   P.O. 0   I.V. 2062.3   IV  Piggyback 450   Total Intake 2512.3   Urine 250   Blood 700   Total Output 950   Net +1562.3         Physical Exam: General: NAD.  Thin.  Upright in bed.  [place] in place.  Foley bag w/ amber urine. Resp: No increased wob Cardio: regular rate and rhythm ABD flat, soft Neuro: Does not follow commands or respond to questions MSK Exam limited as patient Does not follow commands or respond to questions. Dressing C/D/I Abduction pillow in place Moves both feet intermittently    Albina BilletHenry Calvin Martensen III, PA-C 05/02/2017, 6:46 AM

## 2017-05-02 NOTE — Evaluation (Signed)
Physical Therapy Evaluation Patient Details Name: Jeanette Montoya MRN: [id number] DOB: [birth year] Today's Date: 05/02/2017   History of Present Illness  Jeanette Montoya a 83 y.o.femalewho complains of A mechanical fall resulting in a left hip fx. s/p arthroplasty bipolar hip with posterior approach.  Clinical Impression  Patient is s/p above surgery resulting in functional limitations due to the deficits listed below (see PT Problem List). Pt is max Ax2 for bed mobility and stand pivot transfer to chair. Patient will benefit from skilled PT to increase their independence and safety with mobility to allow discharge to the venue listed below.       Follow Up Recommendations SNF;Supervision/Assistance - 24 hour    Equipment Recommendations  Other (comment) (TBD at next venue)    Recommendations for Other Services OT consult     Precautions / Restrictions Precautions Precautions: Fall;Posterior Hip Precaution Booklet Issued: No Precaution Comments: Pt decreased cogntition. Will provided to family Required Braces or Orthoses: Other Brace/Splint (Abbduction pillow) Restrictions Weight Bearing Restrictions: Yes LLE Weight Bearing: Weight bearing as tolerated      Mobility  Bed Mobility Overal bed mobility: Needs Assistance Bed Mobility: Supine to Sit     Supine to sit: Max assist;+2 for physical assistance     General bed mobility comments: Pt required Max A for BLE management from supine to EOB as well as trunk support when transition into seated position.   Transfers Overall transfer level: Needs assistance Equipment used: None Transfers: Squat Pivot Transfers     Squat pivot transfers: Total assist;+2 safety/equipment     General transfer comment: Pt required total A to squat pivot from EOB to recliner in preparation for dinner meal        Balance Overall balance assessment: History of Falls                                            Pertinent Vitals/Pain Pain Assessment: Faces Faces Pain Scale: Hurts even more Pain Location: L hip with movement Pain Descriptors / Indicators: Grimacing;Moaning Pain Intervention(s): Limited activity within patient's tolerance;Monitored during session;Repositioned;Ice applied  VSS    Home Living Family/patient expects to be discharged to:: Skilled nursing facility                 Additional Comments: Per RN, pt lived with her son PTA.     Prior Function Level of Independence: Needs assistance         Comments: No family available to confirm functional level PTA. Expect pt was Max A for ADLs.        Extremity/Trunk Assessment   Upper Extremity Assessment Upper Extremity Assessment: Difficult to assess due to impaired cognition    Lower Extremity Assessment Lower Extremity Assessment: Difficult to assess due to impaired cognition       Communication   Communication: Other (comment)  Cognition Arousal/Alertness: Awake/alert Behavior During Therapy: Flat affect Overall Cognitive Status: History of cognitive impairments - at baseline                                 General Comments: Pt with dementia diagnosis PTA      General Comments General comments (skin integrity, edema, etc.): No family present to confirm PLOF and home set up.    Exercises     Assessment/Plan  PT Assessment Patient needs continued PT services  PT Problem List Decreased strength;Decreased range of motion;Decreased activity tolerance;Decreased balance;Decreased mobility;Decreased safety awareness;Decreased knowledge of precautions;Pain       PT Treatment Interventions Gait training;Functional mobility training;Therapeutic activities;Therapeutic exercise;Balance training;Patient/family education;Cognitive remediation    PT Goals (Current goals can be found in the Care Plan section)  Acute Rehab PT Goals Patient Stated Goal: Not stated PT Goal Formulation:  Patient unable to participate in goal setting Time For Goal Achievement: 05/16/17 Potential to Achieve Goals: Poor    Frequency Min 3X/week   Barriers to discharge        Co-evaluation PT/OT/SLP Co-Evaluation/Treatment: Yes Reason for Co-Treatment: For patient/therapist safety;Necessary to address cognition/behavior during functional activity PT goals addressed during session: Mobility/safety with mobility;Balance OT goals addressed during session: ADL's and self-care       AM-PAC PT "6 Clicks" Daily Activity  Outcome Measure Difficulty turning over in bed (including adjusting bedclothes, sheets and blankets)?: Total Difficulty moving from lying on back to sitting on the side of the bed? : Total Difficulty sitting down on and standing up from a chair with arms (e.g., wheelchair, bedside commode, etc,.)?: Total Help needed moving to and from a bed to chair (including a wheelchair)?: Total Help needed walking in hospital room?: Total Help needed climbing 3-5 steps with a railing? : Total 6 Click Score: 6    End of Session Equipment Utilized During Treatment: Gait belt;Oxygen Activity Tolerance: Patient limited by pain;Other (comment) (limited by dementia) Patient left: in chair;with call bell/phone within reach;with chair alarm set Nurse Communication: Mobility status;Weight bearing status;Precautions PT Visit Diagnosis: Other abnormalities of gait and mobility (R26.89);History of falling (Z91.81);Muscle weakness (generalized) (M62.81);Adult, failure to thrive (R62.7);Other symptoms and signs involving the nervous system (R29.898);Pain Pain - Right/Left: Left Pain - part of body: Hip    Time: 1610-9604 PT Time Calculation (min) (ACUTE ONLY): 24 min   Charges:   PT Evaluation $PT Eval Moderate Complexity: 1 Procedure     PT G Codes:        Jeanette Montoya PT, DPT Acute Rehabilitation  [phone] Pager [phone]    Jeanette Montoya [facility] 05/02/2017,  5:38 PM

## 2017-05-02 NOTE — Progress Notes (Signed)
[facility]                                                                              Patient Demographics  Jeanette Montoya, is a 83 y.o. female, DOB - 11/13/1922, ZOX:[id number]  Admit date - 04/30/2017   Admitting Physician Haydee Salter, MD  Outpatient Primary MD for the patient is Blair Heys, MD  Outpatient specialists:   LOS - 2  days    Chief Complaint  Patient presents with  . Fall       Brief summary   Jeanette Montoya is a 83 y.o. female with medical history significant for advanced dementia followed by neurology in the outpatient setting, dyslipidemia, hypertension, asthma, glaucoma who was brought to the ER by EMS after unwitnessed fall at home with associated left leg pain. X-ray in the ER revealed left femoral neck fracture. CT of the head and neck showed no acute traumatic injuries but there was an incidental finding of a left thyroid nodule concerning for malignancy. Orthopedics was consulted. Also noticed troponin initial 0.17.   Assessment & Plan    Principal Problem:   Left displaced femoral neck fracture (HCC) - Presented after unwitnessed mechanical fall versus syncopal episode resulting in left femoral neck fracture - 2-D echo showed EF of 65-70% with normal wall motion, no regional wall motion abnormalities.  - Orthopedics was consulted, patient underwent left bipolar hip arthroplasty on 5/14, postop day #1 - Pain control, DVT prophylaxis per orthopedics   Active Problems:   Thrombocytopenia (HCC) - Unclear etiology, platelets trending down, on Lovenox, monitor closely - Anemia panel reviewed, anemia of chronic disease  HTN (hypertension) -Currently controlled, BP on softer side -Was on HCTZ prior to admission, continue to hold    Asthma -Not actively wheezing and was not on MDI prior to admission    Left thyroid nodule -Incidental finding on CT at time of admission -TSH normal, left thyroid nodule 3.1X 2.4 cm  with a coarse calcification on the CT head, will obtain thyroid ultrasound prior to DC or if family wants to pursue further management    Dementia/FTT/moderate-severe protein calorie malnutrition -Continue Namenda and Aricept -Patient's weight has decreased nearly 20 pounds since this time last year (115 lbs >> 99 lbs) -Nutrition consultation - UA negative for UTI    HLD (hyperlipidemia) -Continue preadmission Vytorin    Glaucoma -Continue Alphagan, betaoptic, Trusopt, and Lumigan    CKD (chronic kidney disease), stage III - Follow creatinine function closely, currently at baseline  Code Status: Full CODE STATUS DVT Prophylaxis:  lovenox  Family Communication: No family member at the bedside   Disposition Plan:  Time Spent in minutes  25 minutes  Procedures:    Consultants:   Orthopedics  Antimicrobials:   Vancomycin perioperatively   Medications  Scheduled Meds: . aspirin  325 mg Oral Daily  . betaxolol  1 drop Both Eyes BID  . brimonidine  1 drop Both Eyes TID  . docusate sodium  100 mg Oral BID  . donepezil  10 mg Oral Daily  . dorzolamide  1 drop Both Eyes BID  . enoxaparin (LOVENOX) injection  30 mg Subcutaneous Q24H  . ezetimibe-simvastatin  1 tablet Oral QHS  . feeding supplement (ENSURE ENLIVE)  237 mL Oral BID BM  . latanoprost  1 drop Both Eyes QHS  . memantine  10 mg Oral BID  . senna  1 tablet Oral BID   Continuous Infusions: . dextrose 5 % and 0.9 % NaCl with KCl 20 mEq/L 75 mL/hr at 05/02/17 0703  . lactated ringers    . methocarbamol (ROBAXIN)  IV     PRN Meds:.bisacodyl, hydrALAZINE, HYDROcodone-acetaminophen, magnesium citrate, menthol-cetylpyridinium **OR** phenol, methocarbamol **OR** methocarbamol (ROBAXIN)  IV, morphine injection, morphine injection, polyethylene glycol   Antibiotics   Anti-infectives    Start     Dose/Rate Route Frequency Ordered Stop   05/01/17 1900  ceFAZolin (ANCEF) IVPB 2g/100 mL premix     2 g 200 mL/hr  over 30 Minutes Intravenous Every 6 hours 05/01/17 1547 05/02/17 0132   05/01/17 0600  ceFAZolin (ANCEF) IVPB 2g/100 mL premix  Status:  Discontinued     2 g 200 mL/hr over 30 Minutes Intravenous On call to O.R. 04/30/17 1610 04/30/17 1617   04/30/17 1630  vancomycin (VANCOCIN) IVPB 1000 mg/200 mL premix     1,000 mg 200 mL/hr over 60 Minutes Intravenous To ShortStay Surgical 04/30/17 1617 05/01/17 1252        Subjective:   Jeanette Montoya was seen and examined today. Somewhat confused, has history of advanced dementia, appears comfortable but does not respond to any questions. No fevers.  Objective:   Vitals:   05/01/17 1525 05/01/17 1550 05/01/17 2129 05/02/17 0622  BP: 127/61 124/60 (!) 109/50 100/80  Pulse: 87 99 84 87  Resp: 15  16 16   Temp: 97.2 F (36.2 C) 97.6 F (36.4 C) 98.1 F (36.7 C) 98.3 F (36.8 C)  TempSrc: Oral Axillary Axillary Axillary  SpO2: 100% 98% 97% 98%    Intake/Output Summary (Last 24 hours) at 05/02/17 1314 Last data filed at 05/02/17 1015  Gross per 24 hour  Intake           2462.5 ml  Output             1150 ml  Net           1312.5 ml     Wt Readings from Last 3 Encounters:  04/26/17 45.1 kg (99 lb 6.4 oz)  04/26/16 51.8 kg (114 lb 3.2 oz)  10/23/15 52.2 kg (115 lb)     Exam  General: awake, appears comfortable   HEENT:  PERRLA, EOMI   Neck: Supple, no JVD, no masses  Cardiovascular: S1 and S2 clear, regular rate and rhythm  Respiratory: CTAB  Gastrointestinal: Soft, NT, ND, NBS  Ext: no cyanosis clubbing or edema  Neuro: no new deficits  Skin: No rashes  Psych: somewhat confused   Data Reviewed:  I have personally reviewed following labs and imaging studies  Micro Results Recent Results (from the past 240 hour(s))  Surgical pcr screen     Status: None   Collection Time: 04/30/17  8:04 PM  Result Value Ref Range Status   MRSA, PCR NEGATIVE NEGATIVE Final   Staphylococcus aureus NEGATIVE NEGATIVE Final     Comment:        The Xpert SA Assay (FDA approved for NASAL specimens in patients over 53 years of age), is one component of a comprehensive surveillance program.  Test performance has been validated by [facility] for patients greater than or equal to 82 year old.  It is not intended to diagnose infection nor to guide or monitor treatment.   Urine culture     Status: None   Collection Time: 04/30/17  8:10 PM  Result Value Ref Range Status   Specimen Description URINE, RANDOM  Final   Special Requests NONE  Final   Culture NO GROWTH  Final   Report Status 05/02/2017 FINAL  Final    Radiology Reports Dg Chest 1 View  Result Date: 04/30/2017 CLINICAL DATA:  Left hip fracture. EXAM: CHEST 1 VIEW COMPARISON:  July 12, 2011 FINDINGS: The heart size and mediastinal contours are within normal limits. Both lungs are clear. The visualized skeletal structures are unremarkable. IMPRESSION: No active disease. Electronically Signed   By: Gerome Sam III M.D   On: 04/30/2017 08:15   Ct Head Wo Contrast  Result Date: 04/30/2017 CLINICAL DATA:  Unwitnessed fall.  Dementia EXAM: CT HEAD WITHOUT CONTRAST CT CERVICAL SPINE WITHOUT CONTRAST TECHNIQUE: Multidetector CT imaging of the head and cervical spine was performed following the standard protocol without intravenous contrast. Multiplanar CT image reconstructions of the cervical spine were also generated. COMPARISON:  MRI head 11/02/2012 FINDINGS: CT HEAD FINDINGS Brain: Moderate atrophy. Chronic white matter hypodensity bilaterally. Negative for acute infarct, hemorrhage, or mass lesion. Vascular: Negative for hyperdense vessel. Skull: Negative for skull fracture. Sinuses/Orbits: Negative.  Bilateral cataract removal. Other: None CT CERVICAL SPINE FINDINGS Alignment: Mild anterolisthesis C5-6 and C7-T1 Skull base and vertebrae: Negative for fracture Soft tissues and spinal canal: Left thyroid nodule with heterogeneous enhancement and coarse  calcification. The nodule measures 3.1 x 2.4 cm. Neoplasm not excluded although this is most likely benign. Disc levels: Facet joint effusion bilaterally at C2-3. Facet degeneration most prominent on the right at C3-4 and C4-5. Minimal disc space narrowing. Upper chest: Negative Other: None IMPRESSION: No acute intracranial abnormality Negative for cervical spine fracture 3.1 x 2.4 cm left thyroid nodule with coarse calcification. This may represent benign or malignant nodule. Electronically Signed   By: Marlan Palau M.D.   On: 04/30/2017 08:42   Ct Cervical Spine Wo Contrast  Result Date: 04/30/2017 CLINICAL DATA:  Unwitnessed fall.  Dementia EXAM: CT HEAD WITHOUT CONTRAST CT CERVICAL SPINE WITHOUT CONTRAST TECHNIQUE: Multidetector CT imaging of the head and cervical spine was performed following the standard protocol without intravenous contrast. Multiplanar CT image reconstructions of the cervical spine were also generated. COMPARISON:  MRI head 11/02/2012 FINDINGS: CT HEAD FINDINGS Brain: Moderate atrophy. Chronic white matter hypodensity bilaterally. Negative for acute infarct, hemorrhage, or mass lesion. Vascular: Negative for hyperdense vessel. Skull: Negative for skull fracture. Sinuses/Orbits: Negative.  Bilateral cataract removal. Other: None CT CERVICAL SPINE FINDINGS Alignment: Mild anterolisthesis C5-6 and C7-T1 Skull base and vertebrae: Negative for fracture Soft tissues and spinal canal: Left thyroid nodule with heterogeneous enhancement and coarse calcification. The nodule measures 3.1 x 2.4 cm. Neoplasm not excluded although this is most likely benign. Disc levels: Facet joint effusion bilaterally at C2-3. Facet degeneration most prominent on the right at C3-4 and C4-5. Minimal disc space narrowing. Upper chest: Negative Other: None IMPRESSION: No acute intracranial abnormality Negative for cervical spine fracture 3.1 x 2.4 cm left thyroid nodule with coarse calcification. This may represent  benign or malignant nodule. Electronically Signed   By: Marlan Palau M.D.   On: 04/30/2017 08:42   Pelvis Portable  Result Date: 05/01/2017 CLINICAL DATA:  Post- operative left total hip arthroplasty EXAM: PORTABLE PELVIS 1-2 VIEWS COMPARISON:  Preoperative pelvis radiograph revealing a subcapital  fracture of the left hip. FINDINGS: The bones are is subjectively osteopenic. There is a prosthetic left hip joint. Radiographic positioning of the prosthetic components is good. The interface with the native bone appears normal. No native bone fracture is observed. IMPRESSION: No immediate postprocedure complication following left hip joint prosthesis placement. Electronically Signed   By: David  SwazilandJordan M.D.   On: 05/01/2017 15:13   Dg Hip Unilat With Pelvis 2-3 Views Left  Result Date: 04/30/2017 CLINICAL DATA:  Pain after fall. EXAM: DG HIP (WITH OR WITHOUT PELVIS) 2-3V LEFT COMPARISON:  None. FINDINGS: A left hip fracture is identified. No dislocation. The right hip and pelvic bones are otherwise grossly intact. IMPRESSION: Left hip fracture without dislocation. Electronically Signed   By: Gerome Samavid  Williams III M.D   On: 04/30/2017 08:14   Dg Hip Unilat With Pelvis 2-3 Views Right  Result Date: 04/18/2017 CLINICAL DATA:  Right hip pain. The patient is uncertain if she suffered a right hip injury. Initial encounter. EXAM: DG HIP (WITH OR WITHOUT PELVIS) 2-3V RIGHT COMPARISON:  None. FINDINGS: No acute bony or joint abnormality is identified. No notable degenerative change about the hips. Lower lumbar spondylosis is seen. No focal bony lesion. Soft tissues unremarkable. IMPRESSION: No acute abnormality. Lower lumbar degenerative disease. Electronically Signed   By: Drusilla Kannerhomas  Dalessio M.D.   On: 04/18/2017 11:07    Lab Data:  CBC:  Recent Labs Lab 04/30/17 0725 05/01/17 0544 05/02/17 0601  WBC 6.3 5.0 5.8  NEUTROABS 5.1  --   --   HGB 11.6* 11.2* 9.1*  HCT 34.4* 33.1* 27.5*  MCV 99.1 99.4 99.6    PLT 122* 97* 85*   Basic Metabolic Panel:  Recent Labs Lab 04/30/17 0725 04/30/17 1132 05/01/17 0544 05/02/17 0601  NA 142  --  144 143  K 3.1*  --  3.6 3.5  CL 103  --  106 110  CO2 26  --  24 24  GLUCOSE 94  --  112* 169*  BUN 17  --  17 12  CREATININE 1.71*  --  1.18* 1.08*  CALCIUM 10.0  --  9.0 8.4*  MG  --  2.2  --   --    GFR: Estimated Creatinine Clearance: 23.7 mL/min (A) (by C-G formula based on SCr of 1.08 mg/dL (H)). Liver Function Tests: No results for input(s): AST, ALT, ALKPHOS, BILITOT, PROT, ALBUMIN in the last 168 hours. No results for input(s): LIPASE, AMYLASE in the last 168 hours. No results for input(s): AMMONIA in the last 168 hours. Coagulation Profile:  Recent Labs Lab 04/30/17 0725  INR 1.15   Cardiac Enzymes:  Recent Labs Lab 04/30/17 1132 04/30/17 1838 05/01/17 0000 05/01/17 0544  CKTOTAL 98  --   --   --   TROPONINI 0.17* 0.18* 0.18* 0.13*   BNP (last 3 results) No results for input(s): PROBNP in the last 8760 hours. HbA1C: No results for input(s): HGBA1C in the last 72 hours. CBG:  Recent Labs Lab 04/30/17 2023  GLUCAP 87   Lipid Profile: No results for input(s): CHOL, HDL, LDLCALC, TRIG, CHOLHDL, LDLDIRECT in the last 72 hours. Thyroid Function Tests:  Recent Labs  04/30/17 1345  TSH 2.513   Anemia Panel:  Recent Labs  04/30/17 1132  VITAMINB12 1,380*  FOLATE 48.7  FERRITIN 101  TIBC 252  IRON 95  RETICCTPCT 2.4   Urine analysis:    Component Value Date/Time   COLORURINE AMBER (A) 04/30/2017 2009   APPEARANCEUR CLEAR 04/30/2017  2009   LABSPEC 1.021 04/30/2017 2009   PHURINE 5.0 04/30/2017 2009   GLUCOSEU NEGATIVE 04/30/2017 2009   HGBUR NEGATIVE 04/30/2017 2009   BILIRUBINUR NEGATIVE 04/30/2017 2009   KETONESUR 20 (A) 04/30/2017 2009   PROTEINUR NEGATIVE 04/30/2017 2009   NITRITE NEGATIVE 04/30/2017 2009   LEUKOCYTESUR NEGATIVE 04/30/2017 2009     Deshante Cassell M.D. [facility] 05/02/2017, 1:14 PM  Pager: [phone] Between 7am to 7pm - call Pager - [phone]  After 7pm go to [email or web address] - password [email or web address]  Call night coverage person covering after 7pm

## 2017-05-03 DIAGNOSIS — E43 Unspecified severe protein-calorie malnutrition: Secondary | ICD-10-CM

## 2017-05-03 DIAGNOSIS — I10 Essential (primary) hypertension: Secondary | ICD-10-CM

## 2017-05-03 DIAGNOSIS — E041 Nontoxic single thyroid nodule: Secondary | ICD-10-CM

## 2017-05-03 DIAGNOSIS — N183 Chronic kidney disease, stage 3 (moderate): Secondary | ICD-10-CM

## 2017-05-03 DIAGNOSIS — D649 Anemia, unspecified: Secondary | ICD-10-CM

## 2017-05-03 DIAGNOSIS — E785 Hyperlipidemia, unspecified: Secondary | ICD-10-CM

## 2017-05-03 DIAGNOSIS — D696 Thrombocytopenia, unspecified: Secondary | ICD-10-CM

## 2017-05-03 DIAGNOSIS — F0390 Unspecified dementia without behavioral disturbance: Secondary | ICD-10-CM

## 2017-05-03 LAB — CBC
HEMATOCRIT: 23.1 % — AB (ref 36.0–46.0)
HEMOGLOBIN: 7.9 g/dL — AB (ref 12.0–15.0)
MCH: 33.8 pg (ref 26.0–34.0)
MCHC: 34.2 g/dL (ref 30.0–36.0)
MCV: 98.7 fL (ref 78.0–100.0)
Platelets: 94 10*3/uL — ABNORMAL LOW (ref 150–400)
RBC: 2.34 MIL/uL — ABNORMAL LOW (ref 3.87–5.11)
RDW: 14.3 % (ref 11.5–15.5)
WBC: 5.6 10*3/uL (ref 4.0–10.5)

## 2017-05-03 LAB — BASIC METABOLIC PANEL
Anion gap: 8 (ref 5–15)
BUN: 13 mg/dL (ref 6–20)
CHLORIDE: 114 mmol/L — AB (ref 101–111)
CO2: 23 mmol/L (ref 22–32)
Calcium: 8.2 mg/dL — ABNORMAL LOW (ref 8.9–10.3)
Creatinine, Ser: 0.82 mg/dL (ref 0.44–1.00)
GFR calc non Af Amer: >60 mL/min (ref >60)
Glucose, Bld: 141 mg/dL — ABNORMAL HIGH (ref 65–99)
POTASSIUM: 3.7 mmol/L (ref 3.5–5.1)
SODIUM: 145 mmol/L (ref 135–145)

## 2017-05-03 NOTE — Progress Notes (Signed)
   Assessment: 2 Days Post-Op  S/P Procedure(s) (LRB): ARTHROPLASTY BIPOLAR HIP (HEMIARTHROPLASTY) (Left) by Dr. Jewel Baizeimothy D. Murphy on 05/01/17  Principal Problem:   Left displaced femoral neck fracture (HCC) Active Problems:   Thrombocytopenia (HCC)   Left thyroid nodule   HTN (hypertension)   Dementia   Asthma   Acute hypokalemia   HLD (hyperlipidemia)   Anemia   CKD (chronic kidney disease), stage Montoya-IV   Protein-calorie malnutrition, severe  Acute Blood loss anemia -Hgb 7.9 < 9.1 <11.2.  Platelets up to 94 from 85.  Likely with some dilutional component.  No sign of active bleeding.     No recent administration of pain medications.  Does not appear to be in pain.  Plan: Posterior hip precautions Up with therapy Incentive Spirometry Apply ice  Weight Bearing: Weight Bearing as Tolerated (WBAT).  Posterior hip precautions. Dressings: Clean dressing prn.  VTE prophylaxis: Lovenox, SCDs, ambulation.  Recommend Lovenox for 30 days post op. Dispo: Per primary.  SNF.  [facility] preferred facility.  Subjective: Does not follow commands or respond to questions.  No family at bedside.  Objective:   VITALS:   Vitals:   05/02/17 0622 05/02/17 1300 05/02/17 2311 05/03/17 0449  BP: 100/80 (!) 118/56 (!) 114/59 (!) 129/54  Pulse: 87 80 72 75  Resp: 16 16 16 16   Temp: 98.3 F (36.8 C) 99.2 F (37.3 C) 98.8 F (37.1 C) 97.9 F (36.6 C)  TempSrc: Axillary Axillary Oral Oral  SpO2: 98% 100% 97% 97%   CBC Latest Ref Rng & Units 05/03/2017 05/02/2017 05/01/2017  WBC 4.0 - 10.5 K/uL 5.6 5.8 5.0  Hemoglobin 12.0 - 15.0 g/dL 7.9(L) 9.1(L) 11.2(L)  Hematocrit 36.0 - 46.0 % 23.1(L) 27.5(L) 33.1(L)  Platelets 150 - 400 K/uL 94(L) 85(L) 97(L)   BMP Latest Ref Rng & Units 05/03/2017 05/02/2017 05/01/2017  Glucose 65 - 99 mg/dL 409(W141(H) 119(J169(H) 478(G112(H)  BUN 6 - 20 mg/dL 13 12 17   Creatinine 0.44 - 1.00 mg/dL 9.560.82 2.13(Y1.08(H) 8.65(H1.18(H)  Sodium 135 - 145 mmol/L 145 143 144  Potassium 3.5 - 5.1  mmol/L 3.7 3.5 3.6  Chloride 101 - 111 mmol/L 114(H) 110 106  CO2 22 - 32 mmol/L 23 24 24   Calcium 8.9 - 10.3 mg/dL 8.2(L) 8.4(L) 9.0   Intake/Output      05/15 0701 - 05/16 0700 05/16 0701 - 05/17 0700   P.O.     I.V.     IV Piggyback     Total Intake       Urine 500    Blood     Total Output 500     Net -500            Physical Exam: General: NAD.  Thin.  Upright in bed asleep on arrival.  No increased wob Neuro: Does not follow commands or respond to questions MSK Exam limited as patient Does not follow commands or respond to questions. Dressing C/D/I Abduction pillow in place Moves feet intermittently    Jeanette BilletHenry Calvin Martensen III, PA-C 05/03/2017, 8:02 AM

## 2017-05-03 NOTE — Clinical Social Work Note (Signed)
Clinical Social Work Assessment  Patient Details  Name: Jeanette Montoya MRN: [id number] Date of Birth: 1922/08/08  Date of referral:  05/03/17               Reason for consult:  Facility Placement                Permission sought to share information with:  Facility Medical sales representativeContact Representative, Family Supports Permission granted to share information::  Yes, Verbal Permission Granted  Name::     Jeanette Montoya  Agency::  SNF  Relationship::  Son  SolicitorContact Information:     Housing/Transportation Living arrangements for the past 2 months:  Single Family Home Source of Information:  Adult Children Patient Interpreter Needed:  None Criminal Activity/Legal Involvement Pertinent to Current Situation/Hospitalization:  No - Comment as needed Significant Relationships:  Adult Children Lives with:  Adult Children Do you feel safe going back to the place where you live?  No Need for family participation in patient care:  Yes (Comment)  Care giving concerns:  CSW received consult for possible SNF placement at time of discharge. Patient is disoriented. CSW spoke with patient's son and daughter regarding PT recommendation of SNF placement at time of discharge. Patient's son reported that he is currently unable to care for patient at their home given patient's current physical needs and fall risk. Patient's son expressed understanding of PT recommendation and is agreeable to SNF placement at time of discharge. CSW to continue to follow and assist with discharge planning needs.   Social Worker assessment / plan:  CSW spoke with patient's son and daughter [facility] before [facility].  Employment status:  Retired Database administratornsurance information:  Managed Medicare PT Recommendations:  Skilled Nursing Facility Information / Referral to community resources:  Skilled Nursing Facility  Patient/Family's Response to care:  Patient's son and daughter recognize need for rehab before returning  home and [place]. They reported preference for [facility].  Patient/Family's Understanding of and Emotional Response to Diagnosis, Current Treatment, and Prognosis:  Patient/family is realistic regarding therapy needs and expressed being hopeful for SNF placement. Patient's son expressed understanding of CSW role and discharge process as well as patient's medical condition. No questions/concerns about plan or treatment.    Emotional Assessment Appearance:  Appears stated age Attitude/Demeanor/Rapport:  Unable to Assess Affect (typically observed):  Unable to Assess Orientation:   (Disoriented) Alcohol / Substance use:  Not Applicable Psych involvement (Current and /or in the community):  No (Comment)  Discharge Needs  Concerns to be addressed:  Care Coordination Readmission within the last 30 days:  No Current discharge risk:  None Barriers to Discharge:  Continued Medical Work up   Jeanette Montoya, LCSWA 05/03/2017, 10:42 AM

## 2017-05-03 NOTE — Progress Notes (Signed)
Pt 's medicines crushed and placed into applesauce. Pt had a difficult time swallowing. Eye drops applied successfully. Pt resting comfortably in bed.

## 2017-05-03 NOTE — Progress Notes (Signed)
Physical Therapy Treatment Patient Details Name: Jeanette Montoya MRN: [id number] DOB: 1922/04/03 Today's Date: 05/03/2017    History of Present Illness Jeanette Mustlizabeth M Widemonis a 83 y.o.femalewho complains of A mechanical fall resulting in a left hip fx. s/p arthroplasty bipolar hip with posterior approach.    PT Comments    Pt with little to no carryover and poor quality of standing in stedy frame despite assistance and facilitation to correct posture.   Cognition continues to hinder patient's progress.  Pt resting comfortably in chair post tx with neighbor visiting.    Follow Up Recommendations        Equipment Recommendations  Other (comment) (TBD at next venue)    Recommendations for Other Services OT consult     Precautions / Restrictions Precautions Precautions: Fall;Posterior Hip Precaution Booklet Issued: No Precaution Comments: Pt decreased cognition. Required Braces or Orthoses: Other Brace/Splint Restrictions Weight Bearing Restrictions: Yes LLE Weight Bearing: Weight bearing as tolerated    Mobility  Bed Mobility Overal bed mobility: Needs Assistance Bed Mobility: Supine to Sit     Supine to sit: Max assist     General bed mobility comments: Pt required assist to advance LEs to edge of bed and to elevate trunk into sitting.    Transfers Overall transfer level: Needs assistance Equipment used:  (used sara stedy for transfer from bed to chair.  ) Transfers: Squat Pivot Transfers     Squat pivot transfers: Total assist;+2 safety/equipment;Max assist     General transfer comment: Pt required hand over hand placement to reach for bar on stedy.  Pt able to clear bottom and move into standing but required increased assist for hip extension and placement on B stedy plates for transfer from bed to chair.  Due to cognitive deficits patient reaching L and Leaning L.  Increased external assist to correct posturing.    Ambulation/Gait Ambulation/Gait  assistance:  (Pt is not safe or able at this time.  )               Stairs            Wheelchair Mobility    Modified Rankin (Stroke Patients Only)       Balance Overall balance assessment: History of Falls;Needs assistance   Sitting balance-Leahy Scale: Poor       Standing balance-Leahy Scale: Zero                              Cognition Arousal/Alertness: Awake/alert Behavior During Therapy: Flat affect Overall Cognitive Status: History of cognitive impairments - at baseline                                 General Comments: Pt with dementia diagnosis PTA      Exercises      General Comments        Pertinent Vitals/Pain Pain Assessment: Faces Faces Pain Scale: Hurts little more Pain Location: L hip with movement Pain Descriptors / Indicators: Grimacing;Moaning Pain Intervention(s): Monitored during session;Repositioned    Home Living                      Prior Function            PT Goals (current goals can now be found in the care plan section) Acute Rehab PT Goals Patient Stated Goal: Not stated Potential to Achieve  Goals: Poor Progress towards PT goals: Progressing toward goals    Frequency    Min 3X/week      PT Plan Current plan remains appropriate    Co-evaluation              AM-PAC PT "6 Clicks" Daily Activity  Outcome Measure  Difficulty turning over in bed (including adjusting bedclothes, sheets and blankets)?: Total Difficulty moving from lying on back to sitting on the side of the bed? : Total Difficulty sitting down on and standing up from a chair with arms (e.g., wheelchair, bedside commode, etc,.)?: Total Help needed moving to and from a bed to chair (including a wheelchair)?: Total Help needed walking in hospital room?: Total Help needed climbing 3-5 steps with a railing? : Total 6 Click Score: 6    End of Session Equipment Utilized During Treatment:  (sara  stedy) Activity Tolerance: Patient limited by pain;Other (comment) Patient left: in chair;with call bell/phone within reach;with chair alarm set Nurse Communication: Mobility status;Weight bearing status;Precautions Pain - Right/Left: Left Pain - part of body: Hip     Time: 1530-1615 PT Time Calculation (min) (ACUTE ONLY): 45 min  Charges:  $Therapeutic Activity: 38-52 mins                    G Codes:       [name], PTA pager [phone]    [name] 05/03/2017, 4:38 PM

## 2017-05-03 NOTE — Progress Notes (Signed)
PROGRESS NOTE    Jeanette Montoya  UYQ:[id number] DOB: [birth year] DOA: 04/30/2017 PCP: Jeanette HeysEhinger, Robert, MD    Brief Narrative:  Jeanette Montoya a 83 y.o.femalewith medical history significant for advanced dementia followed by neurology in the outpatient setting, dyslipidemia, hypertension, asthma, glaucoma who was brought to the ER by EMS after unwitnessed fall at home with associated left leg pain. X-ray in the ER revealed left femoral neck fracture. CT of the head and neck showed no acute traumatic injuries but there was an incidental finding of a left thyroid nodule concerning for malignancy. Orthopedics was consulted. Also noticed troponin initial 0.17.    Assessment & Plan:   Principal Problem:   Left displaced femoral neck fracture (HCC) Active Problems:   Thrombocytopenia (HCC)   Left thyroid nodule   HTN (hypertension)   Dementia   Asthma   Acute hypokalemia   HLD (hyperlipidemia)   Anemia   CKD (chronic kidney disease), stage III-IV   Protein-calorie malnutrition, severe  Left displaced femoral neck fracture (HCC) - Presented after unwitnessed mechanical fall versus syncopal episode resulting in left femoral neck fracture - 2-D echo showed EF of 65-70% with normal wall motion, no regional wall motion abnormalities.  - Orthopedics was consulted, patient underwent left bipolar hip arthroplasty on 5/14, postop day #1 - Pain control, DVT prophylaxis per orthopedics   Active Problems:   Thrombocytopenia (HCC) - Unclear etiology, platelets count fluctuating however remained in stable.  - Anemia panel reviewed, anemia of chronic disease  Anemia Patient's hemoglobin currently at 7.9 from 9.1 from 11.2. Could likely be secondary to acute blood loss anemia postoperatively. Follow H&H. Transfusion threshold hemoglobin less than 7.  HTN (hypertension) -Currently controlled, BP stable.  -Was on HCTZ prior to admission, continue to hold.  Asthma -Not  actively wheezing and was not on MDI prior to admission  Left thyroid nodule -Incidental finding on CT at time of admission -TSH normal, left thyroid nodule 3.1X 2.4 cm with a coarse calcification on the CT head, will obtain thyroid ultrasound.   Dementia/FTT/moderate-severe protein calorie malnutrition -Continue Namenda and Aricept -Patient's weight has decreased nearly 20 pounds since this time last year (115 lbs >> 99 lbs) -Nutrition consultation - UA negative for UTI  HLD (hyperlipidemia) -Continue Vytorin  Glaucoma -Continue Alphagan, betaoptic, Trusopt, and Lumigan    CKD (chronic kidney disease), stage III - Stable.     DVT prophylaxis: Lovenox Code Status: Full Family Communication: Updated patient no family at bedside. Disposition Plan: Skilled nursing facility.   Consultants:  Orthopedics: Dr. Margarita Ranaimothy Murphy 04/30/2017 Cardiology Dr.Nahser 05/01/2017  Procedures:   CT head CT C-spine 04/30/2017  2-D echo 05/01/2017  Plain films of the pelvis 05/01/2017  Plain films of pelvis and left hip 04/30/2017  Antimicrobials:   None   Subjective: Patient laying in bed. Pleasantly confused. Denies any shortness of breath or chest pain. Minimally verbal.  Objective: Vitals:   05/02/17 1300 05/02/17 2311 05/03/17 0449 05/03/17 1300  BP: (!) 118/56 (!) 114/59 (!) 129/54 (!) 129/56  Pulse: 80 72 75 87  Resp: 16 16 16    Temp: 99.2 F (37.3 C) 98.8 F (37.1 C) 97.9 F (36.6 C) 97.9 F (36.6 C)  TempSrc: Axillary Oral Oral Oral  SpO2: 100% 97% 97% 97%    Intake/Output Summary (Last 24 hours) at 05/03/17 1328 Last data filed at 05/03/17 0900  Gross per 24 hour  Intake              100 ml  Output              200 ml  Net             -100 ml   There were no vitals filed for this visit.  Examination:  General exam: Appears calm and comfortable  Respiratory system: Clear to auscultation anterior lung fields.Marland Kitchen Respiratory effort  normal. Cardiovascular system: S1 & S2 heard, RRR. No JVD, murmurs, rubs, gallops or clicks. No pedal edema. Gastrointestinal system: Abdomen is nondistended, soft and nontender. No organomegaly or masses felt. Normal bowel sounds heard. Central nervous system: Alert. No focal neurological deficits. Extremities: Symmetric 5 x 5 power. Skin: No rashes, lesions or ulcers Psychiatry: Judgement and insight appear poor. Mood & affect appropriate.     Data Reviewed: I have personally reviewed following labs and imaging studies  CBC:  Recent Labs Lab 04/30/17 0725 05/01/17 0544 05/02/17 0601 05/03/17 0535  WBC 6.3 5.0 5.8 5.6  NEUTROABS 5.1  --   --   --   HGB 11.6* 11.2* 9.1* 7.9*  HCT 34.4* 33.1* 27.5* 23.1*  MCV 99.1 99.4 99.6 98.7  PLT 122* 97* 85* 94*   Basic Metabolic Panel:  Recent Labs Lab 04/30/17 0725 04/30/17 1132 05/01/17 0544 05/02/17 0601 05/03/17 0535  NA 142  --  144 143 145  K 3.1*  --  3.6 3.5 3.7  CL 103  --  106 110 114*  CO2 26  --  24 24 23   GLUCOSE 94  --  112* 169* 141*  BUN 17  --  17 12 13   CREATININE 1.71*  --  1.18* 1.08* 0.82  CALCIUM 10.0  --  9.0 8.4* 8.2*  MG  --  2.2  --   --   --    GFR: Estimated Creatinine Clearance: 31.2 mL/min (by C-G formula based on SCr of 0.82 mg/dL). Liver Function Tests: No results for input(s): AST, ALT, ALKPHOS, BILITOT, PROT, ALBUMIN in the last 168 hours. No results for input(s): LIPASE, AMYLASE in the last 168 hours. No results for input(s): AMMONIA in the last 168 hours. Coagulation Profile:  Recent Labs Lab 04/30/17 0725  INR 1.15   Cardiac Enzymes:  Recent Labs Lab 04/30/17 1132 04/30/17 1838 05/01/17 0000 05/01/17 0544  CKTOTAL 98  --   --   --   TROPONINI 0.17* 0.18* 0.18* 0.13*   BNP (last 3 results) No results for input(s): PROBNP in the last 8760 hours. HbA1C: No results for input(s): HGBA1C in the last 72 hours. CBG:  Recent Labs Lab 04/30/17 2023  GLUCAP 87   Lipid  Profile: No results for input(s): CHOL, HDL, LDLCALC, TRIG, CHOLHDL, LDLDIRECT in the last 72 hours. Thyroid Function Tests:  Recent Labs  04/30/17 1345  TSH 2.513   Anemia Panel: No results for input(s): VITAMINB12, FOLATE, FERRITIN, TIBC, IRON, RETICCTPCT in the last 72 hours. Sepsis Labs: No results for input(s): PROCALCITON, LATICACIDVEN in the last 168 hours.  Recent Results (from the past 240 hour(s))  Surgical pcr screen     Status: None   Collection Time: 04/30/17  8:04 PM  Result Value Ref Range Status   MRSA, PCR NEGATIVE NEGATIVE Final   Staphylococcus aureus NEGATIVE NEGATIVE Final    Comment:        The Xpert SA Assay (FDA approved for NASAL specimens in patients over 86 years of age), is one component of a comprehensive surveillance program.  Test performance has been validated by [facility] for patients greater  than or equal to 82 year old. It is not intended to diagnose infection nor to guide or monitor treatment.   Urine culture     Status: None   Collection Time: 04/30/17  8:10 PM  Result Value Ref Range Status   Specimen Description URINE, RANDOM  Final   Special Requests NONE  Final   Culture NO GROWTH  Final   Report Status 05/02/2017 FINAL  Final         Radiology Studies: Pelvis Portable  Result Date: 05/01/2017 CLINICAL DATA:  Post- operative left total hip arthroplasty EXAM: PORTABLE PELVIS 1-2 VIEWS COMPARISON:  Preoperative pelvis radiograph revealing a subcapital fracture of the left hip. FINDINGS: The bones are is subjectively osteopenic. There is a prosthetic left hip joint. Radiographic positioning of the prosthetic components is good. The interface with the native bone appears normal. No native bone fracture is observed. IMPRESSION: No immediate postprocedure complication following left hip joint prosthesis placement. Electronically Signed   By: David  [place] M.D.   On: 05/01/2017 15:13        Scheduled Meds: . acetaminophen   1,000 mg Oral Q8H  . aspirin  325 mg Oral Daily  . betaxolol  1 drop Both Eyes BID  . brimonidine  1 drop Both Eyes TID  . docusate sodium  100 mg Oral BID  . donepezil  10 mg Oral Daily  . dorzolamide  1 drop Both Eyes BID  . enoxaparin (LOVENOX) injection  30 mg Subcutaneous Q24H  . ezetimibe-simvastatin  1 tablet Oral QHS  . feeding supplement (ENSURE ENLIVE)  237 mL Oral BID BM  . latanoprost  1 drop Both Eyes QHS  . memantine  10 mg Oral BID  . senna  1 tablet Oral BID   Continuous Infusions: . dextrose 5 % and 0.9 % NaCl with KCl 20 mEq/L 75 mL/hr at 05/02/17 0703  . lactated ringers    . methocarbamol (ROBAXIN)  IV       LOS: 3 days    Time spent: 35 mins    Gjon Letarte, MD [facility] Pager [phone]  If 7PM-7AM, please contact night-coverage [email or web address] Password [email or web address] 05/03/2017, 1:28 PM

## 2017-05-03 NOTE — NC FL2 (Signed)
[place] MEDICAID FL2 LEVEL OF CARE SCREENING TOOL     IDENTIFICATION  Patient Name: Jeanette Montoya Birthdate: [birth year] Sex: female Admission Date (Current Location): 04/30/2017  [facility] and [place] Number:  Producer, television/film/video and Address:  [facility] [place], [address], [place], [place] [address]      Provider Number: [id number]  Attending Physician Name and Address:  Rodolph Bong, MD  Relative Name and Phone Number:  Myra Gianotti, [phone]     Current Level of Care: Hospital Recommended Level of Care: Skilled Nursing Facility Prior Approval Number:    Date Approved/Denied:   PASRR Number: [id number]  Discharge Plan: SNF    Current Diagnoses: Patient Active Problem List   Diagnosis Date Noted  . Protein-calorie malnutrition, severe 05/01/2017  . Thrombocytopenia (HCC) 04/30/2017  . Left thyroid nodule 04/30/2017  . HTN (hypertension) 04/30/2017  . Dementia 04/30/2017  . Asthma 04/30/2017  . Acute hypokalemia 04/30/2017  . Left displaced femoral neck fracture (HCC) 04/30/2017  . HLD (hyperlipidemia) 04/30/2017  . Anemia 04/30/2017  . CKD (chronic kidney disease), stage III-IV 04/30/2017  . Alzheimer's disease 04/26/2016  . Mental disorder   . Memory loss 04/18/2014  . Other persistent mental disorders due to conditions classified elsewhere 08/15/2013  . Seasonal and perennial allergic rhinitis 02/18/2011  . Conjunctivitis, allergic, chronic 03/11/2008  . CHRONIC RHINITIS 02/07/2008  . HYPERTENSION 09/01/2007  . Allergic-infective asthma 09/01/2007    Orientation RESPIRATION BLADDER Height & Weight      (alert, but non-verbal)  Normal External catheter Weight:   Height:     BEHAVIORAL SYMPTOMS/MOOD NEUROLOGICAL BOWEL NUTRITION STATUS      Continent Diet (please see DC summary)  AMBULATORY STATUS COMMUNICATION OF NEEDS Skin   Extensive Assist Does not communicate Surgical wounds (hip, hydrocolloid dressing)                       Personal Care Assistance Level of Assistance  Bathing, Feeding, Dressing, Total care Bathing Assistance: Maximum assistance Feeding assistance: Maximum assistance Dressing Assistance: Maximum assistance Total Care Assistance: Maximum assistance   Functional Limitations Info             SPECIAL CARE FACTORS FREQUENCY  PT (By licensed PT), OT (By licensed OT)     PT Frequency: 5x/week OT Frequency: 5x/week            Contractures      Additional Factors Info  Code Status, Allergies Code Status Info: Full Allergies Info: Penicillins           Current Medications (05/03/2017):  This is the current hospital active medication list Current Facility-Administered Medications  Medication Dose Route Frequency Provider Last Rate Last Dose  . acetaminophen (TYLENOL) tablet 1,000 mg  1,000 mg Oral Q8H Rai, Ripudeep K, MD   1,000 mg at 05/02/17 2347  . aspirin tablet 325 mg  325 mg Oral Daily Rai, Ripudeep K, MD      . betaxolol (BETOPTIC-S) 0.5 % ophthalmic solution 1 drop  1 drop Both Eyes BID Russella Dar, NP   1 drop at 05/02/17 2357  . bisacodyl (DULCOLAX) suppository 10 mg  10 mg Rectal Daily PRN Sheral Apley, MD      . brimonidine (ALPHAGAN) 0.15 % ophthalmic solution 1 drop  1 drop Both Eyes TID Russella Dar, NP   1 drop at 05/02/17 2356  . dextrose 5 % and 0.9 % NaCl with KCl 20 mEq/L  infusion   Intravenous Continuous Haydee SalterHobbs, Phillip M, MD 75 mL/hr at 05/02/17 0703    . docusate sodium (COLACE) capsule 100 mg  100 mg Oral BID Russella DarEllis, Allison L, NP   100 mg at 05/02/17 2348  . donepezil (ARICEPT) tablet 10 mg  10 mg Oral Daily Russella DarEllis, Allison L, NP   10 mg at 05/01/17 0905  . dorzolamide (TRUSOPT) 2 % ophthalmic solution 1 drop  1 drop Both Eyes BID Russella DarEllis, Allison L, NP   1 drop at 05/02/17 2356  . enoxaparin (LOVENOX) injection 30 mg  30 mg Subcutaneous Q24H Haydee SalterHobbs, Phillip M, MD   30 mg at 05/02/17 1412  . ezetimibe-simvastatin (VYTORIN) 10-20  MG per tablet 1 tablet  1 tablet Oral QHS Russella DarEllis, Allison L, NP   1 tablet at 05/02/17 2352  . feeding supplement (ENSURE ENLIVE) (ENSURE ENLIVE) liquid 237 mL  237 mL Oral BID BM Rai, Ripudeep K, MD      . hydrALAZINE (APRESOLINE) injection 10 mg  10 mg Intravenous Q8H PRN Haydee SalterHobbs, Phillip M, MD      . HYDROcodone-acetaminophen (NORCO/VICODIN) 5-325 MG per tablet 1 tablet  1 tablet Oral Q6H PRN Rai, Ripudeep K, MD      . lactated ringers infusion   Intravenous Continuous Massagee, Terry, MD      . latanoprost (XALATAN) 0.005 % ophthalmic solution 1 drop  1 drop Both Eyes QHS Russella DarEllis, Allison L, NP   1 drop at 05/02/17 2356  . magnesium citrate solution 1 Bottle  1 Bottle Oral Once PRN Sheral ApleyMurphy, Timothy D, MD      . memantine [facility](NAMENDA) tablet 10 mg  10 mg Oral BID Russella DarEllis, Allison L, NP   10 mg at 05/02/17 2353  . menthol-cetylpyridinium (CEPACOL) lozenge 3 mg  1 lozenge Oral PRN Sheral ApleyMurphy, Timothy D, MD       Or  . phenol (CHLORASEPTIC) mouth spray 1 spray  1 spray Mouth/Throat PRN Sheral ApleyMurphy, Timothy D, MD      . methocarbamol (ROBAXIN) tablet 500 mg  500 mg Oral Q6H PRN Russella DarEllis, Allison L, NP       Or  . methocarbamol (ROBAXIN) 500 mg in dextrose 5 % 50 mL IVPB  500 mg Intravenous Q6H PRN Russella DarEllis, Allison L, NP      . morphine 4 MG/ML injection 0.52-1 mg  0.52-1 mg Intravenous Q2H PRN Russella DarEllis, Allison L, NP   1 mg at 05/01/17 1610  . morphine 4 MG/ML injection 2 mg  2 mg Intravenous Q30 min PRN Azalia Bilisampos, Kevin, MD   2 mg at 04/30/17 0727  . polyethylene glycol (MIRALAX / GLYCOLAX) packet 17 g  17 g Oral Daily PRN Russella DarEllis, Allison L, NP      . senna (SENOKOT) tablet 8.6 mg  1 tablet Oral BID Sheral ApleyMurphy, Timothy D, MD   8.6 mg at 05/02/17 2349     Discharge Medications: Please see discharge summary for a list of discharge medications.  Relevant Imaging Results:  Relevant Lab Results:   Additional Information SSN: [id number]  Mearl LatinNadia S Joel Cowin, LCSWA

## 2017-05-04 ENCOUNTER — Inpatient Hospital Stay (HOSPITAL_COMMUNITY): Payer: Medicare Other

## 2017-05-04 DIAGNOSIS — E876 Hypokalemia: Secondary | ICD-10-CM

## 2017-05-04 DIAGNOSIS — E041 Nontoxic single thyroid nodule: Secondary | ICD-10-CM

## 2017-05-04 LAB — BASIC METABOLIC PANEL
Anion gap: 9 (ref 5–15)
BUN: 12 mg/dL (ref 6–20)
CO2: 24 mmol/L (ref 22–32)
Calcium: 8.2 mg/dL — ABNORMAL LOW (ref 8.9–10.3)
Chloride: 113 mmol/L — ABNORMAL HIGH (ref 101–111)
Creatinine, Ser: 0.84 mg/dL (ref 0.44–1.00)
GFR, EST NON AFRICAN AMERICAN: 59 mL/min — AB (ref >60)
GLUCOSE: 117 mg/dL — AB (ref 65–99)
POTASSIUM: 3.5 mmol/L (ref 3.5–5.1)
Sodium: 146 mmol/L — ABNORMAL HIGH (ref 135–145)

## 2017-05-04 LAB — CBC
HEMATOCRIT: 23.6 % — AB (ref 36.0–46.0)
HEMOGLOBIN: 8.1 g/dL — AB (ref 12.0–15.0)
MCH: 34 pg (ref 26.0–34.0)
MCHC: 34.3 g/dL (ref 30.0–36.0)
MCV: 99.2 fL (ref 78.0–100.0)
Platelets: 99 10*3/uL — ABNORMAL LOW (ref 150–400)
RBC: 2.38 MIL/uL — ABNORMAL LOW (ref 3.87–5.11)
RDW: 14.5 % (ref 11.5–15.5)
WBC: 5.5 10*3/uL (ref 4.0–10.5)

## 2017-05-04 MED ORDER — POTASSIUM CHLORIDE 2 MEQ/ML IV SOLN
INTRAVENOUS | Status: DC
  Administered 2017-05-04 – 2017-05-05 (×2): via INTRAVENOUS
  Filled 2017-05-04 (×4): qty 1000

## 2017-05-04 NOTE — Social Work (Addendum)
  CSW f/u with nurse on DC for patient. He indicated that he is f/u with doctor on same.  CSW will continue to follow.

## 2017-05-04 NOTE — Progress Notes (Signed)
Patient refused to take PO scheduled medications last night.  Opened her meds and even tried to give in apple sauce but she would not open her mouth to take them.  I was able however to place her scheduled eye drops.

## 2017-05-04 NOTE — Progress Notes (Signed)
   Assessment: 3 Days Post-Op  S/P Procedure(s) (LRB): ARTHROPLASTY BIPOLAR HIP (HEMIARTHROPLASTY) (Left) by Dr. Jewel Baizeimothy D. Murphy on 05/01/17  Principal Problem:   Left displaced femoral neck fracture (HCC) Active Problems:   Thrombocytopenia (HCC)   Left thyroid nodule   HTN (hypertension)   Dementia   Asthma   Acute hypokalemia   HLD (hyperlipidemia)   Anemia   CKD (chronic kidney disease), stage III-IV   Protein-calorie malnutrition, severe  Acute Blood loss anemia -Hgb Pending < 7.9  < 9.1 <11.2.  Platelets up 05/03/17 to 94 from 85.  Likely with some dilutional component.  No sign of active bleeding.     Slight increase in communication this morning. No recent administration of pain medications.  Denies pain and does not appear to be in pain. Denies appetite.  Poor intake.    Plan: Posterior hip precautions Up with therapy Incentive Spirometry Apply ice  Weight Bearing: Weight Bearing as Tolerated (WBAT).  Posterior hip precautions. Dressings: Clean dressing prn.  VTE prophylaxis: Lovenox, SCDs, ambulation.  Recommend Lovenox for 30 days post op. Dispo: Per primary.  SNF.  [facility] preferred facility.  Subjective: Does not follow commands but did respond to a few questions this morning.  "no" to hunger, and pain.  Says thank you intermittently.   No family at bedside.  Objective:   VITALS:   Vitals:   05/02/17 2311 05/03/17 0449 05/03/17 1300 05/03/17 2236  BP: (!) 114/59 (!) 129/54 (!) 129/56 (!) 112/53  Pulse: 72 75 87 73  Resp: 16 16  16   Temp: 98.8 F (37.1 C) 97.9 F (36.6 C) 97.9 F (36.6 C) 97.8 F (36.6 C)  TempSrc: Oral Oral Oral Oral  SpO2: 97% 97% 97% 96%   CBC Latest Ref Rng & Units 05/03/2017 05/02/2017 05/01/2017  WBC 4.0 - 10.5 K/uL 5.6 5.8 5.0  Hemoglobin 12.0 - 15.0 g/dL 7.9(L) 9.1(L) 11.2(L)  Hematocrit 36.0 - 46.0 % 23.1(L) 27.5(L) 33.1(L)  Platelets 150 - 400 K/uL 94(L) 85(L) 97(L)   BMP Latest Ref Rng & Units 05/04/2017 05/03/2017  05/02/2017  Glucose 65 - 99 mg/dL 161(W117(H) 960(A141(H) 540(J169(H)  BUN 6 - 20 mg/dL 12 13 12   Creatinine 0.44 - 1.00 mg/dL 8.110.84 9.140.82 7.82(N1.08(H)  Sodium 135 - 145 mmol/L 146(H) 145 143  Potassium 3.5 - 5.1 mmol/L 3.5 3.7 3.5  Chloride 101 - 111 mmol/L 113(H) 114(H) 110  CO2 22 - 32 mmol/L 24 23 24   Calcium 8.9 - 10.3 mg/dL 8.2(L) 8.2(L) 8.4(L)   Intake/Output      05/16 0701 - 05/17 0700   P.O. 340   Total Intake 340   Urine 200   Total Output 200   Net +140         Physical Exam: General: NAD.  Thin.  Calm.  Supine in bed asleep on arrival.  No increased wob.   Neuro: Does not follow commands but did respond to a few questions.  Not oriented. Abd: Flat soft, NT, ND MSK Dressing C/D/I Moves feet intermittently    Albina BilletHenry Calvin Martensen III, PA-C 05/04/2017, 6:51 AM

## 2017-05-04 NOTE — Care Management Important Message (Signed)
Important Message  Patient Details  Name: Jeanette Montoya MRN: [id number] Date of Birth: May 20, 1922   Medicare Important Message Given:  Yes    Dorena BodoIris Marylu Dudenhoeffer 05/04/2017, 12:16 PM

## 2017-05-04 NOTE — Progress Notes (Signed)
PROGRESS NOTE    Jeanette Montoya  ZOX:[id number] DOB: 1922/02/20 DOA: 04/30/2017 PCP: Blair Heys, MD    Brief Narrative:  Jeanette Montoya a 83 y.o.femalewith medical history significant for advanced dementia followed by neurology in the outpatient setting, dyslipidemia, hypertension, asthma, glaucoma who was brought to the ER by EMS after unwitnessed fall at home with associated left leg pain. X-ray in the ER revealed left femoral neck fracture. CT of the head and neck showed no acute traumatic injuries but there was an incidental finding of a left thyroid nodule concerning for malignancy. Orthopedics was consulted. Also noticed troponin initial 0.17.    Assessment & Plan:   Principal Problem:   Left displaced femoral neck fracture (HCC) Active Problems:   Thrombocytopenia (HCC)   Left thyroid nodule   HTN (hypertension)   Dementia   Asthma   Acute hypokalemia   HLD (hyperlipidemia)   Anemia   CKD (chronic kidney disease), stage III-IV   Protein-calorie malnutrition, severe  Left displaced femoral neck fracture (HCC) - Presented after unwitnessed mechanical fall versus syncopal episode resulting in left femoral neck fracture - 2-D echo showed EF of 65-70% with normal wall motion, no regional wall motion abnormalities.  - Orthopedics was consulted, patient underwent left bipolar hip arthroplasty on 5/14, postop day #3 - Pain control, DVT prophylaxis per orthopedics   Active Problems:   Thrombocytopenia (HCC) - Unclear etiology, platelets count fluctuating however remained in stable.  - Anemia panel reviewed, anemia of chronic disease  Anemia Patient's hemoglobin currently at 8.1 from 7.9 from 9.1 from 11.2. Could likely be secondary to acute blood loss anemia postoperatively. Follow H&H. Transfusion threshold hemoglobin less than 7.  HTN (hypertension) -Currently controlled, BP stable.  -Was on HCTZ prior to admission, continue to  hold.  Asthma -Not actively wheezing and was not on MDI prior to admission  Left thyroid nodule -Incidental finding on CT at time of admission -TSH normal, left thyroid nodule 3.1X 2.4 cm with a coarse calcification on the CT head, will obtain thyroid ultrasound which is pending.   Dementia/FTT/moderate-severe protein calorie malnutrition -Continue Namenda and Aricept -Patient's weight has decreased nearly 20 pounds since this time last year (115 lbs >> 99 lbs) -Nutrition consultation - UA negative for UTI  HLD (hyperlipidemia) -Continue Vytorin  Glaucoma -Continue Alphagan, betaoptic, Trusopt, and Lumigan    CKD (chronic kidney disease), stage III - Stable.     DVT prophylaxis: Lovenox Code Status: Full Family Communication: Updated patient no family at bedside. Disposition Plan: Skilled nursing facility when bed available..   Consultants:  Orthopedics: Dr. Margarita Rana 04/30/2017 Cardiology Dr.Nahser 05/01/2017  Procedures:   CT head CT C-spine 04/30/2017  2-D echo 05/01/2017  Plain films of the pelvis 05/01/2017  Plain films of pelvis and left hip 04/30/2017  Thyroid ultrasound 05/04/2017  Antimicrobials:   None   Subjective: Patient sleeping. Easily arousable. Minimally verbal.   Objective: Vitals:   05/03/17 1300 05/03/17 2236 05/04/17 0658 05/04/17 1000  BP: (!) 129/56 (!) 112/53 118/61 (!) 169/58  Pulse: 87 73 71 83  Resp:  16    Temp: 97.9 F (36.6 C) 97.8 F (36.6 C) 98.3 F (36.8 C) 98.1 F (36.7 C)  TempSrc: Oral Oral Axillary Oral  SpO2: 97% 96% 96% 99%    Intake/Output Summary (Last 24 hours) at 05/04/17 1319 Last data filed at 05/04/17 0751  Gross per 24 hour  Intake  480 ml  Output              900 ml  Net             -420 ml   There were no vitals filed for this visit.  Examination:  General exam: Sleeping. Respiratory system: Clear to auscultation anterior lung fields. Respiratory effort  normal. Cardiovascular system: S1 & S2 heard, RRR. No JVD, murmurs, rubs, gallops or clicks. No pedal edema. Gastrointestinal system: Abdomen is nondistended, soft and nontender. No organomegaly or masses felt. Normal bowel sounds heard. Central nervous system: Alert. No focal neurological deficits. Extremities: Symmetric 5 x 5 power. Skin: No rashes, lesions or ulcers Psychiatry: Judgement and insight appear poor. Mood & affect appropriate.     Data Reviewed: I have personally reviewed following labs and imaging studies  CBC:  Recent Labs Lab 04/30/17 0725 05/01/17 0544 05/02/17 0601 05/03/17 0535 05/04/17 0544  WBC 6.3 5.0 5.8 5.6 5.5  NEUTROABS 5.1  --   --   --   --   HGB 11.6* 11.2* 9.1* 7.9* 8.1*  HCT 34.4* 33.1* 27.5* 23.1* 23.6*  MCV 99.1 99.4 99.6 98.7 99.2  PLT 122* 97* 85* 94* 99*   Basic Metabolic Panel:  Recent Labs Lab 04/30/17 0725 04/30/17 1132 05/01/17 0544 05/02/17 0601 05/03/17 0535 05/04/17 0544  NA 142  --  144 143 145 146*  K 3.1*  --  3.6 3.5 3.7 3.5  CL 103  --  106 110 114* 113*  CO2 26  --  24 24 23 24   GLUCOSE 94  --  112* 169* 141* 117*  BUN 17  --  17 12 13 12   CREATININE 1.71*  --  1.18* 1.08* 0.82 0.84  CALCIUM 10.0  --  9.0 8.4* 8.2* 8.2*  MG  --  2.2  --   --   --   --    GFR: Estimated Creatinine Clearance: 30.4 mL/min (by C-G formula based on SCr of 0.84 mg/dL). Liver Function Tests: No results for input(s): AST, ALT, ALKPHOS, BILITOT, PROT, ALBUMIN in the last 168 hours. No results for input(s): LIPASE, AMYLASE in the last 168 hours. No results for input(s): AMMONIA in the last 168 hours. Coagulation Profile:  Recent Labs Lab 04/30/17 0725  INR 1.15   Cardiac Enzymes:  Recent Labs Lab 04/30/17 1132 04/30/17 1838 05/01/17 0000 05/01/17 0544  CKTOTAL 98  --   --   --   TROPONINI 0.17* 0.18* 0.18* 0.13*   BNP (last 3 results) No results for input(s): PROBNP in the last 8760 hours. HbA1C: No results for  input(s): HGBA1C in the last 72 hours. CBG:  Recent Labs Lab 04/30/17 2023  GLUCAP 87   Lipid Profile: No results for input(s): CHOL, HDL, LDLCALC, TRIG, CHOLHDL, LDLDIRECT in the last 72 hours. Thyroid Function Tests: No results for input(s): TSH, T4TOTAL, FREET4, T3FREE, THYROIDAB in the last 72 hours. Anemia Panel: No results for input(s): VITAMINB12, FOLATE, FERRITIN, TIBC, IRON, RETICCTPCT in the last 72 hours. Sepsis Labs: No results for input(s): PROCALCITON, LATICACIDVEN in the last 168 hours.  Recent Results (from the past 240 hour(s))  Surgical pcr screen     Status: None   Collection Time: 04/30/17  8:04 PM  Result Value Ref Range Status   MRSA, PCR NEGATIVE NEGATIVE Final   Staphylococcus aureus NEGATIVE NEGATIVE Final    Comment:        The Xpert SA Assay (FDA approved for NASAL specimens in patients  over 81 years of age), is one component of a comprehensive surveillance program.  Test performance has been validated by [facility] for patients greater than or equal to 926 year old. It is not intended to diagnose infection nor to guide or monitor treatment.   Urine culture     Status: None   Collection Time: 04/30/17  8:10 PM  Result Value Ref Range Status   Specimen Description URINE, RANDOM  Final   Special Requests NONE  Final   Culture NO GROWTH  Final   Report Status 05/02/2017 FINAL  Final         Radiology Studies: No results found.      Scheduled Meds: . acetaminophen  1,000 mg Oral Q8H  . aspirin  325 mg Oral Daily  . betaxolol  1 drop Both Eyes BID  . brimonidine  1 drop Both Eyes TID  . docusate sodium  100 mg Oral BID  . donepezil  10 mg Oral Daily  . dorzolamide  1 drop Both Eyes BID  . enoxaparin (LOVENOX) injection  30 mg Subcutaneous Q24H  . ezetimibe-simvastatin  1 tablet Oral QHS  . feeding supplement (ENSURE ENLIVE)  237 mL Oral BID BM  . latanoprost  1 drop Both Eyes QHS  . memantine  10 mg Oral BID  . senna  1 tablet  Oral BID   Continuous Infusions: . methocarbamol (ROBAXIN)  IV    . sodium chloride 0.45 % 1,000 mL with potassium chloride 40 mEq infusion 75 mL/hr at 05/04/17 1035     LOS: 4 days    Time spent: 40 mins    THOMPSON,DANIEL, MD [facility] Pager [phone]  If 7PM-7AM, please contact night-coverage [email or web address] Password [email or web address] 05/04/2017, 1:19 PM

## 2017-05-05 DIAGNOSIS — D62 Acute posthemorrhagic anemia: Secondary | ICD-10-CM

## 2017-05-05 LAB — BASIC METABOLIC PANEL
Anion gap: 7 (ref 5–15)
BUN: 9 mg/dL (ref 6–20)
CALCIUM: 8.1 mg/dL — AB (ref 8.9–10.3)
CHLORIDE: 115 mmol/L — AB (ref 101–111)
CO2: 22 mmol/L (ref 22–32)
CREATININE: 0.75 mg/dL (ref 0.44–1.00)
GFR calc Af Amer: >60 mL/min (ref >60)
GLUCOSE: 88 mg/dL (ref 65–99)
POTASSIUM: 4 mmol/L (ref 3.5–5.1)
SODIUM: 144 mmol/L (ref 135–145)

## 2017-05-05 LAB — CBC
HCT: 24.3 % — ABNORMAL LOW (ref 36.0–46.0)
HEMOGLOBIN: 8.1 g/dL — AB (ref 12.0–15.0)
MCH: 32.9 pg (ref 26.0–34.0)
MCHC: 33.3 g/dL (ref 30.0–36.0)
MCV: 98.8 fL (ref 78.0–100.0)
Platelets: 123 10*3/uL — ABNORMAL LOW (ref 150–400)
RBC: 2.46 MIL/uL — AB (ref 3.87–5.11)
RDW: 13.7 % (ref 11.5–15.5)
WBC: 4.7 10*3/uL (ref 4.0–10.5)

## 2017-05-05 MED ORDER — METHOCARBAMOL 500 MG PO TABS: 500.0000 mg | ORAL_TABLET | Freq: Four times a day (QID) | ORAL | Status: DC | PRN

## 2017-05-05 MED ORDER — BISACODYL 10 MG RE SUPP: 10.0000 mg | Freq: Every day | RECTAL | 0 refills | Status: AC | PRN

## 2017-05-05 MED ORDER — ENOXAPARIN SODIUM 30 MG/0.3ML ~~LOC~~ SOLN: 30.0000 mg | SUBCUTANEOUS | Status: AC

## 2017-05-05 MED ORDER — ENSURE ENLIVE PO LIQD: 237.0000 mL | Freq: Two times a day (BID) | ORAL | 0 refills | Status: DC

## 2017-05-05 MED ORDER — DOCUSATE SODIUM 100 MG PO CAPS: 100.0000 mg | ORAL_CAPSULE | Freq: Two times a day (BID) | ORAL | 0 refills | Status: AC

## 2017-05-05 MED ORDER — ACETAMINOPHEN 500 MG PO TABS: 500.0000 mg | ORAL_TABLET | Freq: Four times a day (QID) | ORAL | 0 refills | Status: AC | PRN

## 2017-05-05 MED ORDER — AMLODIPINE BESYLATE 2.5 MG PO TABS: 2.5000 mg | ORAL_TABLET | Freq: Every day | ORAL | 0 refills | Status: AC

## 2017-05-05 MED ORDER — SENNA 8.6 MG PO TABS
1.0000 | ORAL_TABLET | Freq: Two times a day (BID) | ORAL | 0 refills | Status: AC
Start: 2017-05-05 — End: ?

## 2017-05-05 MED ORDER — POLYETHYLENE GLYCOL 3350 17 G PO PACK: 17.0000 g | PACK | Freq: Every day | ORAL | 0 refills | Status: AC | PRN

## 2017-05-05 NOTE — Progress Notes (Addendum)
Physical Therapy Treatment Patient Details Name: Jeanette Montoya MRN: [id number] DOB: 1922/01/05 Today's Date: 05/05/2017    History of Present Illness Jeanette Rising Widemonis a 83 y.o.femalewho complains of A mechanical fall resulting in a left hip fx. s/p arthroplasty bipolar hip with posterior approach.    PT Comments    Pt is making slow progress towards her goals. Pt maxAx2 for bed mobility, and maxAx2 for power up to Jeanette Montoya but able to stand in [place] for 3 minutes for pericare with minAx1. Pt requires skilled PT to continue to progress bed mobility and transfer training to be able to mobilize in her discharge environment.   Follow Up Recommendations  SNF;Supervision/Assistance - 24 hour     Equipment Recommendations  Other (comment) (TBD at next venue)    Recommendations for Other Services OT consult     Precautions / Restrictions Precautions Precautions: Fall;Posterior Hip Precaution Booklet Issued: No Precaution Comments: Pt decreased cogntition. Will provided to family Required Braces or Orthoses: Other Brace/Splint (Abbduction pillow) Restrictions Weight Bearing Restrictions: Yes LLE Weight Bearing: Weight bearing as tolerated    Mobility  Bed Mobility Overal bed mobility: Needs Assistance Bed Mobility: Supine to Sit     Supine to sit: Max assist;+2 for physical assistance     General bed mobility comments: Pt required Max A for BLE management from supine to EOB as well as trunk support when transition into seated position.   Transfers Overall transfer level: Needs assistance Equipment used: None             General transfer comment: Pt able to stand in [place] for 3 minutes for pericare with miin-modAx1. Pt able to slow descent into the chair from the [facility]      Balance Overall balance assessment: History of Falls                                          Cognition Arousal/Alertness: Awake/alert Behavior During Therapy: Flat  affect Overall Cognitive Status: History of cognitive impairments - at baseline                                 General Comments: Pt with dementia diagnosis PTA      Exercises      General Comments General comments (skin integrity, edema, etc.): Pt answered multiple simple questions with single word answers and said thank you after she was in chair. Pt also readily accepted bites of dessert from her lunch.      Pertinent Vitals/Pain Pain Assessment: Faces Faces Pain Scale: Hurts a little bit Pain Location: L hip with movement Pain Descriptors / Indicators: Grimacing;Moaning Pain Intervention(s): Monitored during session;Limited activity within patient's tolerance;Repositioned  VSS           PT Goals (current goals can now be found in the care plan section) Acute Rehab PT Goals Patient Stated Goal: Not stated PT Goal Formulation: Patient unable to participate in goal setting Time For Goal Achievement: 05/16/17 Potential to Achieve Goals: Poor Progress towards PT goals: Progressing toward goals    Frequency    Min 3X/week      PT Plan Current plan remains appropriate       AM-PAC PT "6 Clicks" Daily Activity  Outcome Measure  Difficulty turning over in bed (including adjusting bedclothes, sheets and blankets)?: Total Difficulty moving  from lying on back to sitting on the side of the bed? : Total Difficulty sitting down on and standing up from a chair with arms (e.g., wheelchair, bedside commode, etc,.)?: Total Help needed moving to and from a bed to chair (including a wheelchair)?: Total Help needed walking in hospital room?: Total Help needed climbing 3-5 steps with a railing? : Total 6 Click Score: 6    End of Session Equipment Utilized During Treatment: Gait belt Activity Tolerance: Patient limited by pain;Other (comment) (limited by dementia) Patient left: in chair;with call bell/phone within reach;with chair alarm set Nurse Communication:  Mobility status;Weight bearing status;Precautions;Other (comment) (asked NT to finish with feeding) PT Visit Diagnosis: Other abnormalities of gait and mobility (R26.89);History of falling (Z91.81);Muscle weakness (generalized) (M62.81);Adult, failure to thrive (R62.7);Other symptoms and signs involving the nervous system (R29.898);Pain Pain - Right/Left: Left Pain - part of body: Hip     Time: [id number] PT Time Calculation (min) (ACUTE ONLY): 31 min  Charges:  $Therapeutic Activity: 23-37 mins                    G Codes:       Inita B. Beverely RisenVan Fleet PT, DPT Acute Rehabilitation  [phone] Pager [phone]     Elon Alaslizabeth B Van Fleet 05/05/2017, 2:54 PM

## 2017-05-05 NOTE — Progress Notes (Addendum)
Patient discharge instructions reviewed with patient and discharge instructions given to transport. Report given to transport and SNF. Transport is taking patient to SNF. All belongings with patient. Patient is not in distress.

## 2017-05-05 NOTE — Plan of Care (Signed)
Problem: Education: Goal: Knowledge of [place] [facility] Education information/materials will improve Outcome: Progressing Reviewed POC with pt. and unsure how much she understands; no verbal response; alert to voice.

## 2017-05-05 NOTE — Progress Notes (Addendum)
   Assessment: 4 Days Post-Op  S/P Procedure(s) (LRB): ARTHROPLASTY BIPOLAR HIP (HEMIARTHROPLASTY) (Left) by Dr. Jewel Baizeimothy D. Murphy on 05/01/17  Principal Problem:   Left displaced femoral neck fracture (HCC) Active Problems:   Thrombocytopenia (HCC)   Left thyroid nodule   HTN (hypertension)   Dementia   Asthma   Acute hypokalemia   HLD (hyperlipidemia)   Anemia   CKD (chronic kidney disease), stage III-IV   Protein-calorie malnutrition, severe   Thyroid nodule  Acute Blood loss anemia - Hgb stable 8.1<< 7.9 <<11.2.  Platelets trending up 123<<<85.  No sign of active bleeding.     No recent administration of pain medications.   Better PO intake.    Plan: Posterior hip precautions Up with therapy Incentive Spirometry Apply ice Acetaminophen prn.  Avoid narcotics dt dementia.    Follow up in the office with Dr. Wandra Feinstein. Murphy in 2 weeks.  Please call with questions.   Weight Bearing: Weight Bearing as Tolerated (WBAT).  Posterior hip precautions. Dressings: Clean dressing prn.  VTE prophylaxis: Lovenox, SCDs, ambulation.  Recommend Lovenox for 30 days post op. Dispo: Per primary.  Likely SNF.  [facility] preferred facility.  Subjective: Does not follow commands.  Few yes/no answers.  No family at bedside.  Objective:   VITALS:   Vitals:   05/04/17 1000 05/04/17 1400 05/04/17 2100 05/05/17 0354  BP: (!) 169/58 132/64 (!) 161/69 (!) 148/57  Pulse: 83 74 81 71  Resp:  16 16 15   Temp: 98.1 F (36.7 C) 98.4 F (36.9 C) 98.4 F (36.9 C) 98.2 F (36.8 C)  TempSrc: Oral Axillary Axillary Axillary  SpO2: 99% 100% 100% 99%   CBC Latest Ref Rng & Units 05/05/2017 05/04/2017 05/03/2017  WBC 4.0 - 10.5 K/uL 4.7 5.5 5.6  Hemoglobin 12.0 - 15.0 g/dL 8.1(L) 8.1(L) 7.9(L)  Hematocrit 36.0 - 46.0 % 24.3(L) 23.6(L) 23.1(L)  Platelets 150 - 400 K/uL 123(L) 99(L) 94(L)   BMP Latest Ref Rng & Units 05/04/2017 05/03/2017 05/02/2017  Glucose 65 - 99 mg/dL 161(W117(H) 960(A141(H) 540(J169(H)  BUN 6 -  20 mg/dL 12 13 12   Creatinine 0.44 - 1.00 mg/dL 8.110.84 9.140.82 7.82(N1.08(H)  Sodium 135 - 145 mmol/L 146(H) 145 143  Potassium 3.5 - 5.1 mmol/L 3.5 3.7 3.5  Chloride 101 - 111 mmol/L 113(H) 114(H) 110  CO2 22 - 32 mmol/L 24 23 24   Calcium 8.9 - 10.3 mg/dL 8.2(L) 8.2(L) 8.4(L)   Intake/Output      05/17 0701 - 05/18 0700 05/18 0701 - 05/19 0700   P.O. 720    I.V. 900    Total Intake 1620     Urine 200    Stool 0    Total Output 200     Net +1420          Stool Occurrence 2 x      Physical Exam: General: NAD.  Very Thin.  Calm.  Upright in bed on arrival.  No increased wob.   Neuro: Does not follow commands.  Alert to voice.  Not oriented. Abd: Flat soft, NT, ND MSK Dressing C/D/I  Moves feet and flexes hips intermittently    Albina BilletHenry Calvin Martensen III, PA-C 05/05/2017, 7:35 AM

## 2017-05-05 NOTE — Clinical Social Work Placement (Addendum)
   CLINICAL SOCIAL WORK PLACEMENT  NOTE  Date:  05/05/2017  Patient Details  Name: Jeanette Montoya MRN: [id number] Date of Birth: Mar 21, 1922  Clinical Social Work is seeking post-discharge placement for this patient at the Skilled  Nursing Facility level of care (*CSW will initial, date and re-position this form in  chart as items are completed):  Yes   Patient/family provided with [facility] Clinical Social Work Department's list of facilities offering this level of care within the geographic area requested by the patient (or if unable, by the patient's family).  Yes   Patient/family informed of their freedom to choose among providers that offer the needed level of care, that participate in Medicare, Medicaid or managed care program needed by the patient, have an available bed and are willing to accept the patient.  Yes   Patient/family informed of [facility]'s ownership interest in [facility] and [facility], as well as of the fact that they are under no obligation to receive care at these facilities.  PASRR submitted to EDS on 05/03/17     PASRR number received on 05/03/17     Existing PASRR number confirmed on       FL2 transmitted to all facilities in geographic area requested by pt/family on 05/03/17     FL2 transmitted to all facilities within larger geographic area on       Patient informed that his/her managed care company has contracts with or will negotiate with certain facilities, including the following:        Yes   Patient/family informed of bed offers received.  [facility]     Physician recommends and patient chooses bed at      [facility] on 05/05/17.  Patient to be transferred to facility by [facility]     Patient family notified on 05/05/17 of transfer.  Name of family member notified:    Jeanette Montoya  PHYSICIAN Please prepare priority discharge summary, including  medications, Please prepare prescriptions, Please sign FL2     Additional Comment:    _______________________________________________ Tresa MoorePatricia V Iszabella Hebenstreit, LCSW 05/05/2017, 8:37 AM

## 2017-05-05 NOTE — Social Work (Signed)
Clinical Social Worker facilitated patient discharge including contacting patient family and facility to confirm patient discharge plans.  Clinical information faxed to facility and family agreeable with plan.  CSW arranged ambulance transport via [facility] to [facility] .  RN to call [phone] report prior to discharge.  Clinical Social Worker will sign off for now as social work intervention is no longer needed. Please consult us again if new need arises.  Doris Mcgilvery, LCSW Clinical Social Worker [phone]    

## 2017-05-05 NOTE — Discharge Summary (Signed)
Physician Discharge Summary  Jeanette Montoya ZOX:[id number] DOB: [birth year] DOA: 04/30/2017  PCP: Blair Heys, MD  Admit date: 04/30/2017 Discharge date: 05/05/2017  Time spent: 65 minutes  Recommendations for Outpatient Follow-up:  1. Follow-up with Dr. Margarita Rana, orthopedics in 2 weeks. 2. Follow-up with M.D. at skilled nursing facility. Patient will need a CBC as well as a basic metabolic profile done one week post discharge to follow-up on hemoglobin, electrolytes and renal function. Patient will also need to be set up for fine-needle aspiration of thyroid nodule noted during the hospitalization and per thyroid ultrasound for further evaluation.   Discharge Diagnoses:  Principal Problem:   Left displaced femoral neck fracture (HCC) Active Problems:   Thrombocytopenia (HCC)   Left thyroid nodule   HTN (hypertension)   Dementia   Asthma   Acute hypokalemia   HLD (hyperlipidemia)   Anemia   CKD (chronic kidney disease), stage III-IV   Protein-calorie malnutrition, severe   Thyroid nodule   Postoperative anemia due to acute blood loss   Discharge Condition: Stable and improved  Diet recommendation: Regular  There were no vitals filed for this visit.  History of present illness:  Per Dr Stefani Dama Jeanette Montoya is a 83 y.o. female with medical history significant for advanced dementia followed by neurology in the outpatient setting, dyslipidemia, hypertension, asthma, glaucoma who was brought to the ER by EMS after expressing an unwitnessed fall at home with associated left leg pain. X-ray in the ER did reveal a left femoral neck fracture. CT of the head and neck showed no acute traumatic injuries but there was an incidental finding of a left thyroid nodule concerning for malignancy.  ED Course:  Vital Signs: BP 122/62   Pulse 78   Temp 98.4 F (36.9 C) (Oral)   Resp 16   SpO2 95%  DG hip: Left hip fracture without dislocation PCXR: Neg CT head and  cervical spine without contrast: As above regarding the traumatic injuries to head or neck; incidental finding of 3.1 x 2.4; her left thyroid nodule with coarse calcification could be benign versus malignant nodule Lab data: Sodium 142, potassium 3.1, chloride 103, CO2 26, glucose 94, BUN 17, creatinine 1.71, calcium 10, white count 6300 with neutrophils 79% and normal absolute neutrophils, hemoglobin 11.6, platelets 122,000, coags normal, type and screen has been completed Medications and treatments: Morphine 2 mg IV 1  Hospital Course:  Left displaced femoral neck fracture (HCC) - Presented after unwitnessed mechanical fall versus syncopal episode resulting in left femoral neck fracture - 2-D echo showed EF of 65-70% with normal wall motion, no regional wall motion abnormalities.  - Orthopedics was consulted, patient underwent left bipolar hip arthroplasty on 5/14 with no complications. - Patient's pain was managed with Tylenol as narcotics were withheld secondary to a history of dementia. Patient was also maintained on Lovenox for DVT prophylaxis. Patient is to follow-up with Dr. Eulah Pont of orthopedics 2 weeks postdischarge.   Active Problems: Thrombocytopenia (HCC) - Unclear etiology, platelets count fluctuating however remained in stable.  - Anemia panel reviewed, anemia of chronic disease  Postop acute blood loss Anemia Patient's hemoglobin remained stable at 8.1 from 11.2 on admission. Likely postoperative acute blood loss anemia. Patient did not have any overt GI bleed. Patient's H&H stabilized at 8.1. Outpatient follow-up.  HTN (hypertension) -Currently controlled, BP stable.  -Was on HCTZ prior to admission, which was discontinued during the hospitalization. Patient be discharged on Norvasc 2.5 mg daily. Outpatient follow-up.   Asthma -  Not actively wheezing and was not on MDI prior to admission. Remained stable.  Left thyroid nodule -Incidental finding on CT at time  of admission -TSH normal, left thyroid nodule 3.1X 2.4 cm with a coarse calcification on the CT head, thyroid ultrasound  was done which showed a 3.8 cm mid left thyroid nodule. Also a 0.8 cm inferior right thyroid nodule. The 3.8 cm mid left thyroid nodule will need a fine-needle aspiration to be done which can be done in the outpatient setting. Outpatient follow-up.   Dementia/FTT/moderate-severe protein calorie malnutrition -Continued on Namenda and Aricept -Patient's weight has decreased nearly 20 pounds since this time last year (115 lbs >> 99 lbs) -Nutrition consultation - UA negative for UTI - Outpatient follow up.  HLD (hyperlipidemia) -Continued on Vytorin.  Glaucoma -Patient was maintained on Alphagan, betaoptic, Trusopt, and Lumigan  CKD (chronic kidney disease), stage III - Remained stable.    Procedures:  CT head CT C-spine 04/30/2017  2-D echo 05/01/2017  Plain films of the pelvis 05/01/2017  Plain films of pelvis and left hip 04/30/2017  Thyroid ultrasound 05/04/2017  Consultations: Orthopedics: Dr. Margarita Rana 04/30/2017 Cardiology Dr.Nahser 05/01/2017   Discharge Exam: Vitals:   05/04/17 2100 05/05/17 0354  BP: (!) 161/69 (!) 148/57  Pulse: 81 71  Resp: 16 15  Temp: 98.4 F (36.9 C) 98.2 F (36.8 C)    General: NAD. Minimally verbal Cardiovascular: RRR Respiratory: CTAB anterior lung field  Discharge Instructions   Discharge Instructions    Diet general    Complete by:  As directed    Increase activity slowly    Complete by:  As directed      Current Discharge Medication List    START taking these medications   Details  acetaminophen (TYLENOL) 500 MG tablet Take 1 tablet (500 mg total) by mouth every 6 (six) hours as needed. Qty: 30 tablet, Refills: 0    amLODipine (NORVASC) 2.5 MG tablet Take 1 tablet (2.5 mg total) by mouth daily. Qty: 30 tablet, Refills: 0    bisacodyl (DULCOLAX) 10 MG suppository Place 1  suppository (10 mg total) rectally daily as needed for moderate constipation. Qty: 12 suppository, Refills: 0    docusate sodium (COLACE) 100 MG capsule Take 1 capsule (100 mg total) by mouth 2 (two) times daily. Qty: 10 capsule, Refills: 0    enoxaparin (LOVENOX) 30 MG/0.3ML injection Inject 0.3 mLs (30 mg total) into the skin daily. Qty: 0 Syringe    feeding supplement, ENSURE ENLIVE, (ENSURE ENLIVE) LIQD Take 237 mLs by mouth 2 (two) times daily between meals. Qty: 237 mL, Refills: 0    methocarbamol (ROBAXIN) 500 MG tablet Take 1 tablet (500 mg total) by mouth every 6 (six) hours as needed for muscle spasms.    polyethylene glycol (MIRALAX / GLYCOLAX) packet Take 17 g by mouth daily as needed for mild constipation. Qty: 14 each, Refills: 0    senna (SENOKOT) 8.6 MG TABS tablet Take 1 tablet (8.6 mg total) by mouth 2 (two) times daily. Qty: 120 each, Refills: 0      CONTINUE these medications which have NOT CHANGED   Details  aspirin 325 MG EC tablet Take 325 mg by mouth daily.    betaxolol (BETOPTIC-S) 0.5 % ophthalmic suspension Place 1 drop into both eyes 2 (two) times daily.  Refills: 9    brimonidine (ALPHAGAN P) 0.1 % SOLN Place 1 drop into both eyes 2 (two) times daily.     donepezil (ARICEPT) 10 MG  tablet Take 1 tablet (10 mg total) by mouth daily. Qty: 90 tablet, Refills: 3    dorzolamide (TRUSOPT) 2 % ophthalmic solution Place 1 drop into both eyes 2 (two) times daily.     ezetimibe-simvastatin (VYTORIN) 10-20 MG per tablet Take 1 tablet by mouth at bedtime.      LUMIGAN 0.01 % SOLN Place 1 drop into both eyes at bedtime. Refills: 11    memantine (NAMENDA) 10 MG tablet Take 1 tablet (10 mg total) by mouth 2 (two) times daily. Qty: 180 tablet, Refills: 3    nitroGLYCERIN (NITROSTAT) 0.4 MG SL tablet Place 0.4 mg under the tongue every 5 (five) minutes as needed for chest pain.       STOP taking these medications     hydrochlorothiazide 25 MG tablet       traMADol (ULTRAM) 50 MG tablet        Allergies  Allergen Reactions  . Penicillins Other (See Comments)    REACTION: fainting spells per pt swelling    Contact information for follow-up providers    Sheral ApleyMurphy, Timothy D, MD. Schedule an appointment as soon as possible for a visit in 2 week(s).   Specialty:  Orthopedic Surgery Contact information: [address] [place] [id number] [phone]            Contact information for after-discharge care    Destination    [facility] Follow up.   Specialty:  Skilled Nursing Facility Contact information: [address] [place] [id number] [phone]                   The results of significant diagnostics from this hospitalization (including imaging, microbiology, ancillary and laboratory) are listed below for reference.    Significant Diagnostic Studies: Dg Chest 1 View  Result Date: 04/30/2017 CLINICAL DATA:  Left hip fracture. EXAM: CHEST 1 VIEW COMPARISON:  July 12, 2011 FINDINGS: The heart size and mediastinal contours are within normal limits. Both lungs are clear. The visualized skeletal structures are unremarkable. IMPRESSION: No active disease. Electronically Signed   By: Gerome Samavid  Williams III M.D   On: 04/30/2017 08:15   Ct Head Wo Contrast  Result Date: 04/30/2017 CLINICAL DATA:  Unwitnessed fall.  Dementia EXAM: CT HEAD WITHOUT CONTRAST CT CERVICAL SPINE WITHOUT CONTRAST TECHNIQUE: Multidetector CT imaging of the head and cervical spine was performed following the standard protocol without intravenous contrast. Multiplanar CT image reconstructions of the cervical spine were also generated. COMPARISON:  MRI head 11/02/2012 FINDINGS: CT HEAD FINDINGS Brain: Moderate atrophy. Chronic white matter hypodensity bilaterally. Negative for acute infarct, hemorrhage, or mass lesion. Vascular: Negative for hyperdense vessel. Skull: Negative for skull fracture.  Sinuses/Orbits: Negative.  Bilateral cataract removal. Other: None CT CERVICAL SPINE FINDINGS Alignment: Mild anterolisthesis C5-6 and C7-T1 Skull base and vertebrae: Negative for fracture Soft tissues and spinal canal: Left thyroid nodule with heterogeneous enhancement and coarse calcification. The nodule measures 3.1 x 2.4 cm. Neoplasm not excluded although this is most likely benign. Disc levels: Facet joint effusion bilaterally at C2-3. Facet degeneration most prominent on the right at C3-4 and C4-5. Minimal disc space narrowing. Upper chest: Negative Other: None IMPRESSION: No acute intracranial abnormality Negative for cervical spine fracture 3.1 x 2.4 cm left thyroid nodule with coarse calcification. This may represent benign or malignant nodule. Electronically Signed   By: Marlan Palauharles  Clark M.D.   On: 04/30/2017 08:42   Ct Cervical Spine Wo Contrast  Result  Date: 04/30/2017 CLINICAL DATA:  Unwitnessed fall.  Dementia EXAM: CT HEAD WITHOUT CONTRAST CT CERVICAL SPINE WITHOUT CONTRAST TECHNIQUE: Multidetector CT imaging of the head and cervical spine was performed following the standard protocol without intravenous contrast. Multiplanar CT image reconstructions of the cervical spine were also generated. COMPARISON:  MRI head 11/02/2012 FINDINGS: CT HEAD FINDINGS Brain: Moderate atrophy. Chronic white matter hypodensity bilaterally. Negative for acute infarct, hemorrhage, or mass lesion. Vascular: Negative for hyperdense vessel. Skull: Negative for skull fracture. Sinuses/Orbits: Negative.  Bilateral cataract removal. Other: None CT CERVICAL SPINE FINDINGS Alignment: Mild anterolisthesis C5-6 and C7-T1 Skull base and vertebrae: Negative for fracture Soft tissues and spinal canal: Left thyroid nodule with heterogeneous enhancement and coarse calcification. The nodule measures 3.1 x 2.4 cm. Neoplasm not excluded although this is most likely benign. Disc levels: Facet joint effusion bilaterally at C2-3. Facet  degeneration most prominent on the right at C3-4 and C4-5. Minimal disc space narrowing. Upper chest: Negative Other: None IMPRESSION: No acute intracranial abnormality Negative for cervical spine fracture 3.1 x 2.4 cm left thyroid nodule with coarse calcification. This may represent benign or malignant nodule. Electronically Signed   By: Marlan Palau M.D.   On: 04/30/2017 08:42   Pelvis Portable  Result Date: 05/01/2017 CLINICAL DATA:  Post- operative left total hip arthroplasty EXAM: PORTABLE PELVIS 1-2 VIEWS COMPARISON:  Preoperative pelvis radiograph revealing a subcapital fracture of the left hip. FINDINGS: The bones are is subjectively osteopenic. There is a prosthetic left hip joint. Radiographic positioning of the prosthetic components is good. The interface with the native bone appears normal. No native bone fracture is observed. IMPRESSION: No immediate postprocedure complication following left hip joint prosthesis placement. Electronically Signed   By: David  [place] M.D.   On: 05/01/2017 15:13   US Thyroid  Result Date: 05/04/2017 CLINICAL DATA:  Incidental on CT. Left thyroid nodule detected by cervical spine CT. EXAM: THYROID ULTRASOUND TECHNIQUE: Ultrasound examination of the thyroid gland and adjacent soft tissues was performed. COMPARISON:  CT of the cervical spine on 04/30/2017 FINDINGS: Parenchymal Echotexture: Moderately heterogenous Isthmus: 0.5 cm Right lobe: 3.6 x 1.0 x 1.2 cm Left lobe: 4.5 x 2.3 x 3.2 cm _________________________________________________________ Estimated total number of nodules >/= 1 cm: 1 Number of spongiform nodules >/=  2 cm not described below (TR1): 0 Number of mixed cystic and solid nodules >/= 1.5 cm not described below (TR2): 0 _________________________________________________________ Nodule # 1: Location: Left; Mid Maximum size: 3.8 cm; Other 2 dimensions: 2.9 x 3.0 cm Composition: solid/almost completely solid (2) Echogenicity: isoechoic (1) Shape: not  taller-than-wide (0) Margins: ill-defined (0) Echogenic foci: macrocalcifications (1) ACR TI-RADS total points: 4. ACR TI-RADS risk category: TR4 (4-6 points). ACR TI-RADS recommendations: **Given size (>/= 1.5 cm) and appearance, fine needle aspiration of this moderately suspicious nodule should be considered based on TI-RADS criteria. _________________________________________________________ Nodule # 2: Location: Right; Inferior Maximum size: 0.8 cm; Other 2 dimensions: 0.6 x 0.6 cm Composition: mixed cystic and solid (1) Echogenicity: isoechoic (1) Shape: not taller-than-wide (0) Margins: ill-defined (0) Echogenic foci: none (0) ACR TI-RADS total points: 2. ACR TI-RADS risk category: TR2 (2 points). ACR TI-RADS recommendations: This nodule does NOT meet TI-RADS criteria for biopsy or dedicated follow-up. _________________________________________________________ IMPRESSION: 1. 3.8 cm mid left thyroid nodule. Fine-needle aspiration should be considered based on TI-RADS criteria. 2. 0.8 cm inferior right thyroid nodule does not meet criteria for biopsy or dedicated follow-up. The above is in keeping with the ACR TI-RADS recommendations - J  Am Coll Radiol 2017;14:587-595. Electronically Signed   By: Irish Lack M.D.   On: 05/04/2017 15:19   Dg Hip Unilat With Pelvis 2-3 Views Left  Result Date: 04/30/2017 CLINICAL DATA:  Pain after fall. EXAM: DG HIP (WITH OR WITHOUT PELVIS) 2-3V LEFT COMPARISON:  None. FINDINGS: A left hip fracture is identified. No dislocation. The right hip and pelvic bones are otherwise grossly intact. IMPRESSION: Left hip fracture without dislocation. Electronically Signed   By: Gerome Sam III M.D   On: 04/30/2017 08:14   Dg Hip Unilat With Pelvis 2-3 Views Right  Result Date: 04/18/2017 CLINICAL DATA:  Right hip pain. The patient is uncertain if she suffered a right hip injury. Initial encounter. EXAM: DG HIP (WITH OR WITHOUT PELVIS) 2-3V RIGHT COMPARISON:  None. FINDINGS: No  acute bony or joint abnormality is identified. No notable degenerative change about the hips. Lower lumbar spondylosis is seen. No focal bony lesion. Soft tissues unremarkable. IMPRESSION: No acute abnormality. Lower lumbar degenerative disease. Electronically Signed   By: Drusilla Kanner M.D.   On: 04/18/2017 11:07    Microbiology: Recent Results (from the past 240 hour(s))  Surgical pcr screen     Status: None   Collection Time: 04/30/17  8:04 PM  Result Value Ref Range Status   MRSA, PCR NEGATIVE NEGATIVE Final   Staphylococcus aureus NEGATIVE NEGATIVE Final    Comment:        The Xpert SA Assay (FDA approved for NASAL specimens in patients over 77 years of age), is one component of a comprehensive surveillance program.  Test performance has been validated by [facility] for patients greater than or equal to 59 year old. It is not intended to diagnose infection nor to guide or monitor treatment.   Urine culture     Status: None   Collection Time: 04/30/17  8:10 PM  Result Value Ref Range Status   Specimen Description URINE, RANDOM  Final   Special Requests NONE  Final   Culture NO GROWTH  Final   Report Status 05/02/2017 FINAL  Final     Labs: Basic Metabolic Panel:  Recent Labs Lab 04/30/17 1132 05/01/17 0544 05/02/17 0601 05/03/17 0535 05/04/17 0544 05/05/17 0643  NA  --  144 143 145 146* 144  K  --  3.6 3.5 3.7 3.5 4.0  CL  --  106 110 114* 113* 115*  CO2  --  24 24 23 24 22   GLUCOSE  --  112* 169* 141* 117* 88  BUN  --  17 12 13 12 9   CREATININE  --  1.18* 1.08* 0.82 0.84 0.75  CALCIUM  --  9.0 8.4* 8.2* 8.2* 8.1*  MG 2.2  --   --   --   --   --    Liver Function Tests: No results for input(s): AST, ALT, ALKPHOS, BILITOT, PROT, ALBUMIN in the last 168 hours. No results for input(s): LIPASE, AMYLASE in the last 168 hours. No results for input(s): AMMONIA in the last 168 hours. CBC:  Recent Labs Lab 04/30/17 0725 05/01/17 0544 05/02/17 0601  05/03/17 0535 05/04/17 0544 05/05/17 0643  WBC 6.3 5.0 5.8 5.6 5.5 4.7  NEUTROABS 5.1  --   --   --   --   --   HGB 11.6* 11.2* 9.1* 7.9* 8.1* 8.1*  HCT 34.4* 33.1* 27.5* 23.1* 23.6* 24.3*  MCV 99.1 99.4 99.6 98.7 99.2 98.8  PLT 122* 97* 85* 94* 99* 123*   Cardiac Enzymes:  Recent Labs Lab 04/30/17 1132 04/30/17 1838 05/01/17 0000 05/01/17 0544  CKTOTAL 98  --   --   --   TROPONINI 0.17* 0.18* 0.18* 0.13*   BNP: BNP (last 3 results) No results for input(s): BNP in the last 8760 hours.  ProBNP (last 3 results) No results for input(s): PROBNP in the last 8760 hours.  CBG:  Recent Labs Lab 04/30/17 2023  GLUCAP 87       Signed:  Tamsin Nader MD.  [facility] 05/05/2017, 12:47 PM

## 2017-05-09 ENCOUNTER — Encounter: Payer: Self-pay | Admitting: Internal Medicine

## 2017-05-09 ENCOUNTER — Non-Acute Institutional Stay (SKILLED_NURSING_FACILITY): Payer: Medicare Other | Admitting: Internal Medicine

## 2017-05-09 DIAGNOSIS — L89150 Pressure ulcer of sacral region, unstageable: Secondary | ICD-10-CM

## 2017-05-09 DIAGNOSIS — D62 Acute posthemorrhagic anemia: Secondary | ICD-10-CM | POA: Diagnosis not present

## 2017-05-09 DIAGNOSIS — S72002A Fracture of unspecified part of neck of left femur, initial encounter for closed fracture: Secondary | ICD-10-CM | POA: Diagnosis not present

## 2017-05-09 DIAGNOSIS — E041 Nontoxic single thyroid nodule: Secondary | ICD-10-CM | POA: Diagnosis not present

## 2017-05-09 DIAGNOSIS — D696 Thrombocytopenia, unspecified: Secondary | ICD-10-CM | POA: Diagnosis not present

## 2017-05-09 NOTE — Assessment & Plan Note (Addendum)
Pursuing possible thyroid cancer does not appear to be logical in this 83 year old with end-stage dementia and DNR deisignation as life expectancy more likely is less than 10 years and patient would have no comprehension of the process and its risks/benefits. Her son concurs with this assessment

## 2017-05-09 NOTE — Progress Notes (Signed)
    MRN: [id number]  Facility Location: [facility]  Room Number: 307  Code Status: DNR  PCP: Blair HeysEhinger, Robert, MD [address] [place] [id number]   This is a comprehensive admission note to [facility] performed on this date less than 30 days from date of admission. Included are preadmission medical/surgical history;reconciled medication list; family history; social history and comprehensive review of systems.  Corrections and additions to the records were documented . Comprehensive physical exam was also performed. Additionally a clinical summary was entered for each active diagnosis pertinent to this admission in the Problem List to enhance continuity of care.   HPI: The patient was hospitalized 5/13-5/18/18 for left displaced femoral neck fracture sustained in an unwitnessed fall at home. Left bipolar hip arthroplasty was performed 5/14 by Dr. Renaye Rakersim Murphy. The patient has advanced dementia, CT of the head and neck revealed no acute traumatic injuries. An incidental finding was a left thyroid nodule concerning for malignancy. Labs 5/18 revealed calcium 8.1, hemoglobin 8.1/hematocrit 24.3 with normochromic, normocytic indices. Platelet count varied from 85,000-123,000. Recommendation was made for fine needle aspiration of thyroid nodule as an outpatient. Follow-up with Dr. Eulah PontMurphy was to be in 2 weeks. [facility] staff documented unstageable sacral decubitus @ admission. Foley left in place to prevent contamination.  Past medical and surgical history: Includes hypertension, asthma, chronic anemia, dyslipidemia, chronic kidney disease stage III-4, and severe protein-caloric malnutrition.  Social history: Reviewed  Family history: Noncontributory due to age and comorbidities  Review of systems:Could not be completed due to dementia and nonverbal state  Physical exam:  Pertinent or positive findings: The patient is nonverbal and  does not engage the examiner. She is cachectic. She lies in a semi-left lateral decubitus position with her left hand supporting her head and chin. She has intermittent exotropia of the right eye. Arcus senilis is present. Proptosis present. She resists oral exam by clamping mouth shut. Clinically she appears to be edentulous. Flow murmur is noted at the left lower sternal border. Heart rhythm is regular. Breath sounds are decreased. Foley catheter is in place. The urine is orange in color. Pedal pulses are decreased. Reflexes are 1.5+ in the upper extremities. She has splotchy vitiligo particularly over the thorax. The general pattern of the lesions is an area of vitiligo with 2 areas of normal pigmentation mimicking " owl's eyes".  General appearance: no acute distress , increased work of breathing is present.   Lymphatic: No lymphadenopathy about the head, neck, axilla . Eyes: No conjunctival inflammation or lid edema is present. There is no scleral icterus. Ears:  External ear exam shows no significant lesions or deformities.   Nose:  External nasal examination shows no deformity or inflammation. Nasal mucosa are pink and moist without lesions ,exudates Neck:  No palpable thyroid, masses, tenderness noted.    Heart:  No gallop, click, rub .  Lungs: without wheezes, rhonchi,rales , rubs. Abdomen:Bowel sounds are normal. Abdomen is soft and nontender with no organomegaly, hernias,masses. GU: deferred  Extremities:  No cyanosis, clubbing,edema  Neurologic exam : Balance,Rhomberg,finger to nose testing could not be completed due to clinical state Skin: Warm & dry w/o tenting. No significant  Rash. Sacral decubitus being monitored by Wound care Nurse.  See clinical summary under each active problem in the Problem List with associated updated therapeutic plan

## 2017-05-09 NOTE — Assessment & Plan Note (Signed)
Labs reviewed, serially improving

## 2017-05-09 NOTE — Patient Instructions (Addendum)
See assessment and plan under each diagnosis in the problem list and acutely for this visit Total time 48 minutes; greater than 50% of the visit spent counseling family and coordinating care for problems addressed at this encounter

## 2017-05-09 NOTE — Assessment & Plan Note (Signed)
Focus to be on reducing medications which aggravate her mental status and balance issues

## 2017-05-09 NOTE — Assessment & Plan Note (Signed)
Monitor CBC and platelet count Supplemental iron

## 2017-05-10 DIAGNOSIS — L89150 Pressure ulcer of sacral region, unstageable: Secondary | ICD-10-CM | POA: Insufficient documentation

## 2017-05-10 NOTE — Assessment & Plan Note (Signed)
Leave Foley in place Note: this is family's main concern @ this time

## 2017-05-11 ENCOUNTER — Encounter: Payer: Self-pay | Admitting: *Deleted

## 2017-05-11 LAB — BASIC METABOLIC PANEL
BUN: 12 mg/dL (ref 4–21)
CREATININE: 0.8 mg/dL (ref 0.5–1.1)
Glucose: 94 mg/dL
Potassium: 4.8 mmol/L (ref 3.4–5.3)
Sodium: 142 mmol/L (ref 137–147)

## 2017-05-11 LAB — CBC AND DIFFERENTIAL
HEMATOCRIT: 26 % — AB (ref 36–46)
Hemoglobin: 8.2 g/dL — AB (ref 12.0–16.0)
Platelets: 329 10*3/uL (ref 150–399)
WBC: 8.4 10*3/mL

## 2017-05-18 ENCOUNTER — Encounter: Payer: Self-pay | Admitting: *Deleted

## 2017-05-18 ENCOUNTER — Non-Acute Institutional Stay (SKILLED_NURSING_FACILITY): Payer: Medicare Other | Admitting: Internal Medicine

## 2017-05-18 ENCOUNTER — Encounter: Payer: Self-pay | Admitting: Internal Medicine

## 2017-05-18 DIAGNOSIS — R319 Hematuria, unspecified: Secondary | ICD-10-CM | POA: Diagnosis not present

## 2017-05-18 LAB — CBC AND DIFFERENTIAL
HCT: 27 % — AB (ref 36–46)
Hemoglobin: 8.2 g/dL — AB (ref 12.0–16.0)
Platelets: 342 10*3/uL (ref 150–399)
WBC: 10 10*3/mL

## 2017-05-18 NOTE — Assessment & Plan Note (Addendum)
Await final C&S results on the urine culture, at this time no frank UTI present based on 65,000 colonies  patient is high risk for colonization with indwelling Foley. Foley present to prevent contamination of sacral wound. CBC If  severe/progressive anemia is present discontinuation of Lovenox must be considered

## 2017-05-18 NOTE — Progress Notes (Signed)
   This is a nursing facility follow up for specific acute issue of hematuria in the context of Lovenox DVT prophylaxis.  Interim medical record and care since last [facility] visit was updated with review of diagnostic studies and change in clinical status since last visit were documented.  HPI: Staff noted blood in the Foley catheter, Lovenox was ordered to be held and urinalysis culture collected. Inadvertently those labs were not completed until 5/30 and Lovenox had not been discontinued. As per staff report the hematuria has not worsened despite continuation of Lovenox. Urinalysis 5/30 revealed 2+ blood, 2+ bacteria, and positive nitrites. At this time culture reveals 65,000 lactose fermenting gram-negative rods. C&S is pending..  Review of systems could not be completed as the patient is nonverbal.  Physical exam: The patient stares ahead blankly. She has ptosis on the left. She is edentulous. Heart rhythm and rate are slow but essentially regular. Breath sounds are decreased. She has rough,bland, irregular hyperpigmentation over the anterior chest. Pedal pulses are decreased. Limb atrophy is present. The urine appears golden within minimal brownish tint in the catheter.  Pertinent or positive findings: General appearance: no acute distress , increased work of breathing is present.   Lymphatic: No lymphadenopathy about the head, neck, axilla . Eyes: No conjunctival inflammation or lid edema is present. There is no scleral icterus. Ears:  External ear exam shows no significant lesions or deformities.   Nose:  External nasal examination shows no deformity or inflammation. Nasal mucosa are pink and moist without lesions ,exudates Oral exam: lips and gums are healthy appearing.There is no oropharyngeal erythema or exudate . Neck:  No thyromegaly, masses, tenderness noted.    Heart:  No gallop, murmur, click, rub .  Lungs: without wheezes, rhonchi,rales , rubs. Abdomen:Bowel  sounds are normal. Abdomen is soft and nontender with no organomegaly, hernias,masses. GU: deferred  Extremities:  No cyanosis, clubbing,edema  Skin: Warm & dry w/o tenting.   See summary under each active problem in the Problem List with associated updated therapeutic plan

## 2017-05-18 NOTE — Patient Instructions (Signed)
See assessment and plan under each diagnosis acutely for this visit  

## 2017-06-18 DEATH — deceased

## 2018-04-30 ENCOUNTER — Ambulatory Visit: Payer: Medicare Other | Admitting: Neurology

## 2018-05-24 IMAGING — CT CT HEAD W/O CM
5 of 8 series · 17 of 47 positions shown, 18 images · non-contrast
Comparison: MRI head 11/02/2012

CLINICAL DATA: Unwitnessed fall.  Dementia

EXAM:
CT HEAD WITHOUT CONTRAST
CT CERVICAL SPINE WITHOUT CONTRAST
TECHNIQUE: Multidetector CT imaging of the head and cervical spine was
performed following the standard protocol without intravenous
contrast. Multiplanar CT image reconstructions of the cervical spine
were also generated.

[Series 4: head without · axial · non-contrast · 0.44mm/px · z∈[+1260,+1410]mm · 3 of 31 slices shown, 4 images]
[im 1/31  brain]
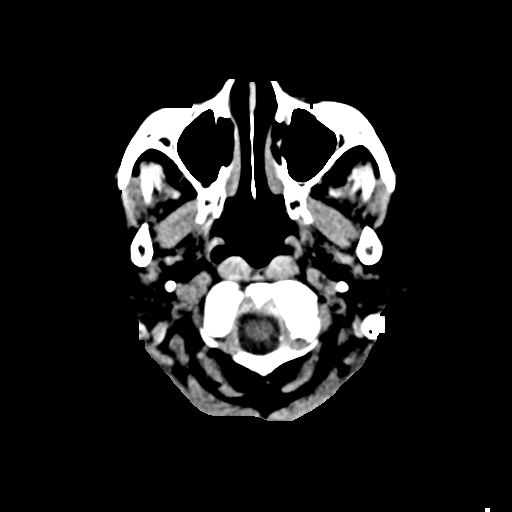
[im 1/31  bone]
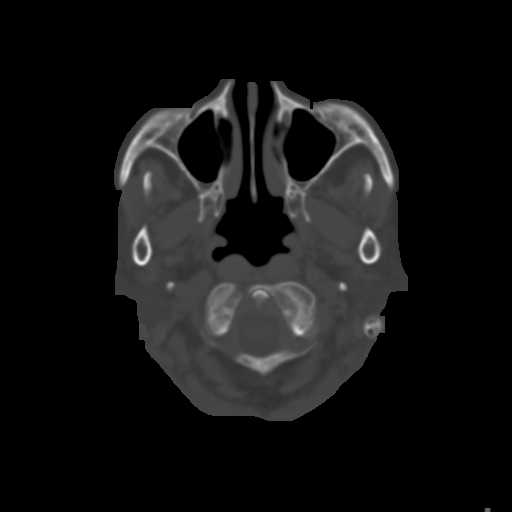
[im 16/31  brain]
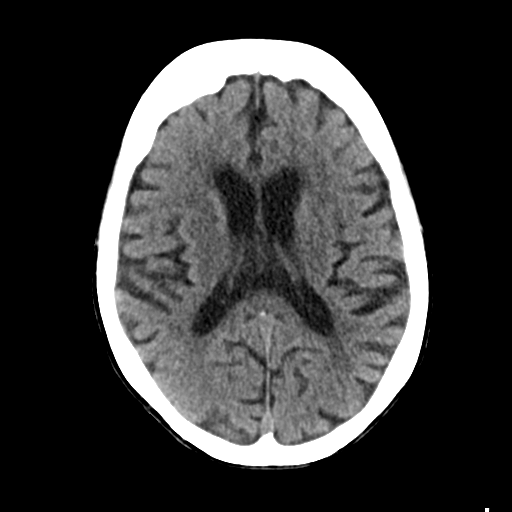
[im 31/31  brain]
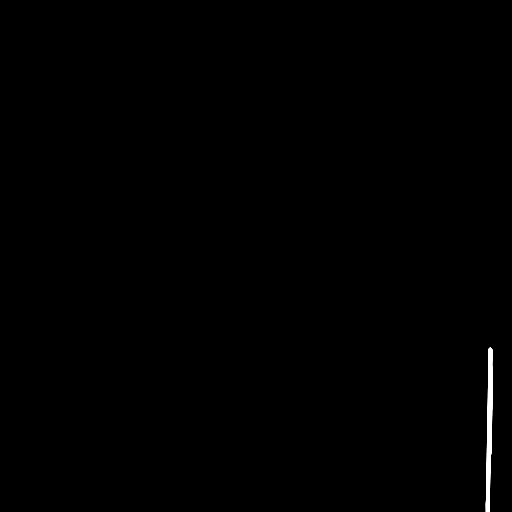

[Series 5: head bone · axial · 0.44mm/px · z∈[+1284,+1384]mm · 5 of 76 slices shown]
[im 13/76  bone]
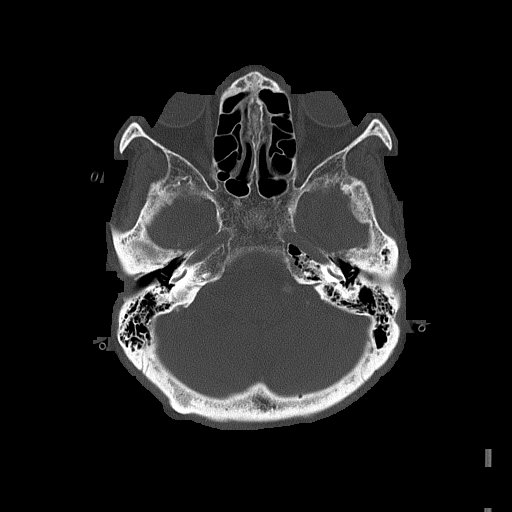
[im 26/76  bone]
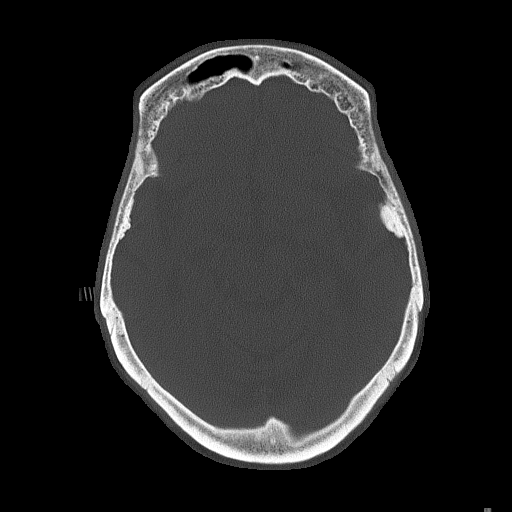
[im 38/76  bone]
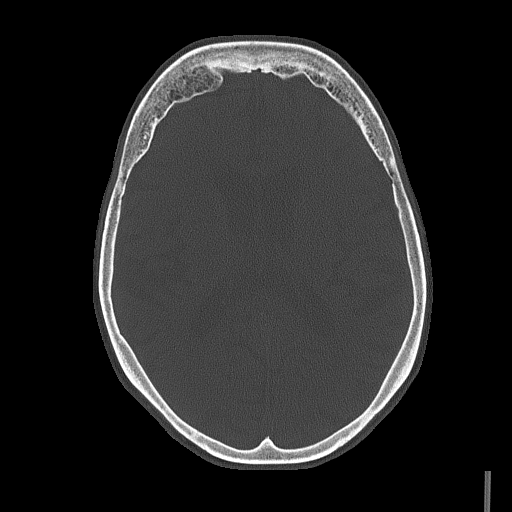
[im 51/76  bone]
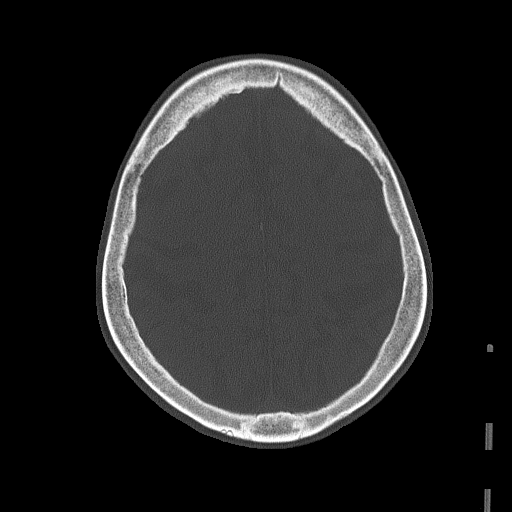
[im 63/76  bone]
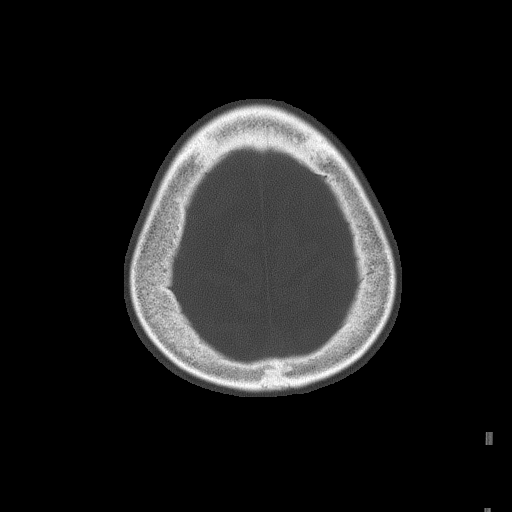

[Series 6: head without cor · coronal · non-contrast · 0.29mm/px · 3 of 64 slices shown]
[im 19/64  brain]
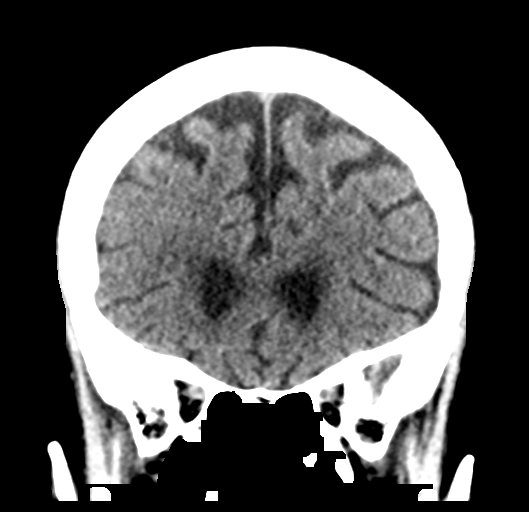
[im 28/64  brain]
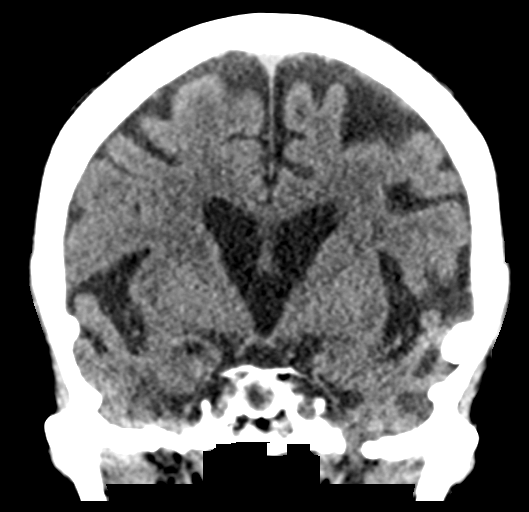
[im 37/64  brain]
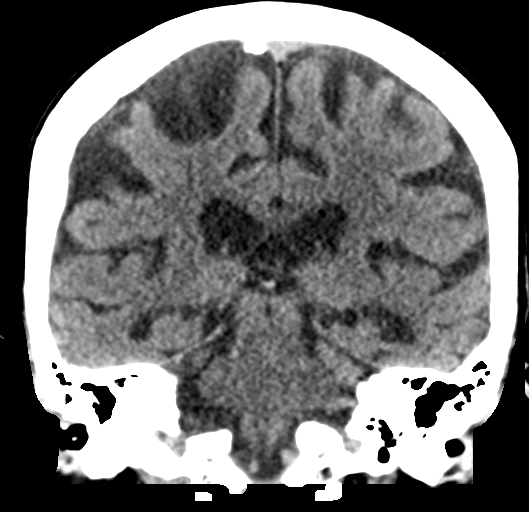

[Series 7: head without sag · sagittal · non-contrast · 0.29mm/px · 2 of 49 slices shown]
[im 17/49  brain]
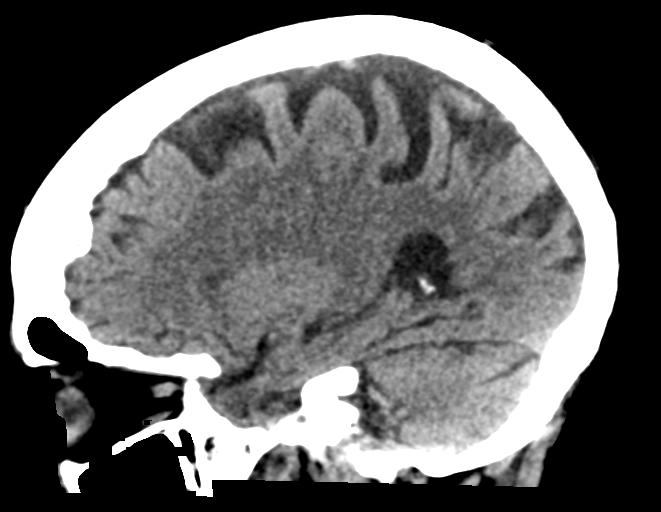
[im 33/49  brain]
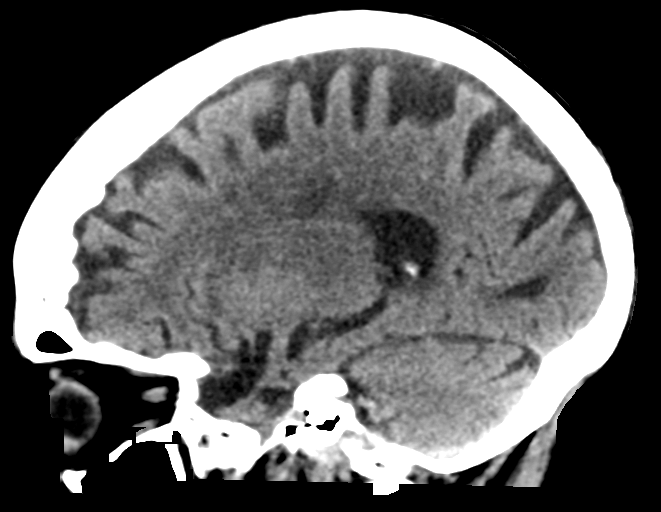

[Series 8: c_spine 2.0 st · axial · 0.33mm/px · z∈[+1145,+1211]mm · 4 of 102 slices shown]
[im 12/102  brain]
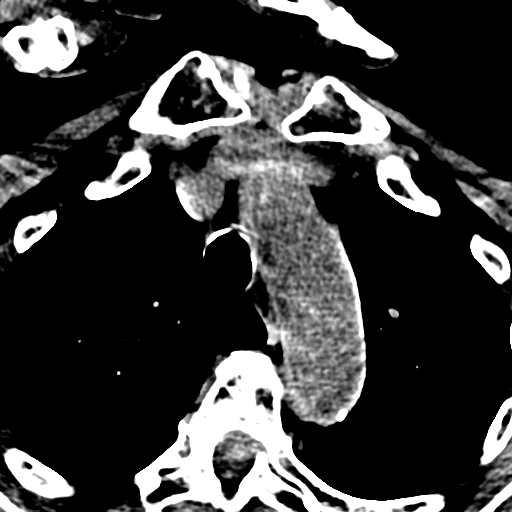
[im 23/102  brain]
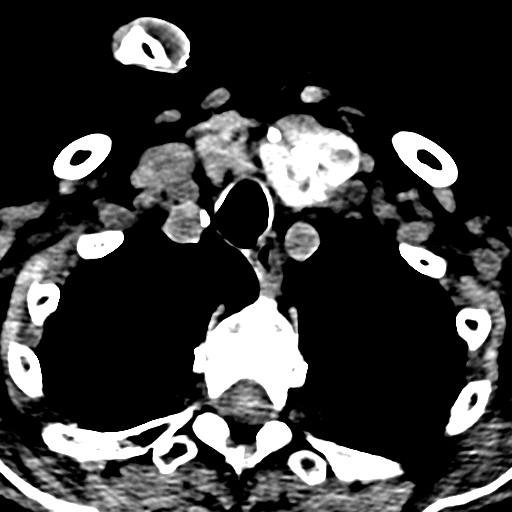
[im 34/102  brain]
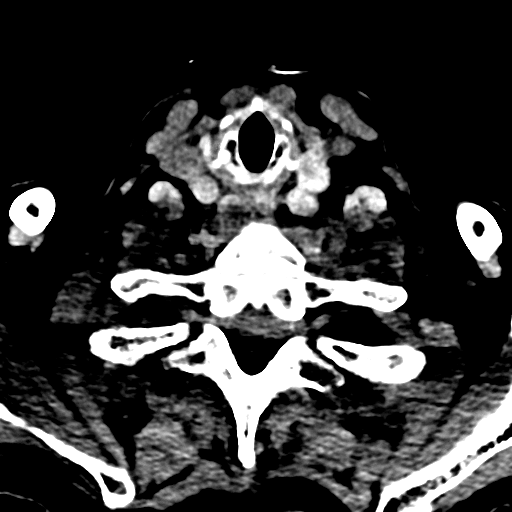
[im 45/102  brain]
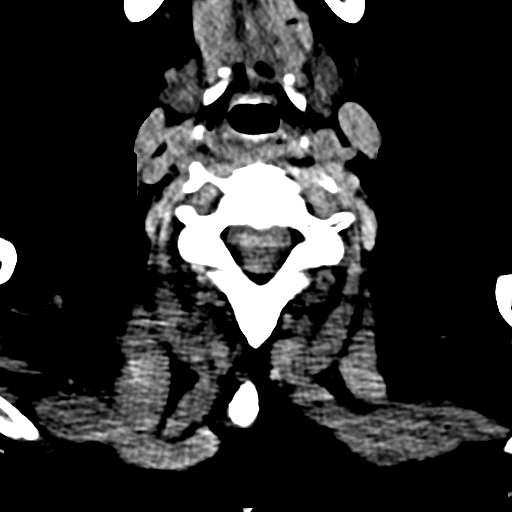

[17 of 47 positions shown; findings below may reference images not displayed]

FINDINGS: CT HEAD FINDINGS

Brain: Moderate atrophy. Chronic white matter hypodensity
bilaterally.

Negative for acute infarct, hemorrhage, or mass lesion.

Vascular: Negative for hyperdense vessel.

Skull: Negative for skull fracture.

Sinuses/Orbits: Negative.  Bilateral cataract removal.

Other: None

CT CERVICAL SPINE FINDINGS

Alignment: Mild anterolisthesis C5-6 and C7-T1

Skull base and vertebrae: Negative for fracture

Soft tissues and spinal canal: Left thyroid nodule with
heterogeneous enhancement and coarse calcification. The nodule
measures 3.1 x 2.4 cm. Neoplasm not excluded although this is most
likely benign.

Disc levels: Facet joint effusion bilaterally at C2-3. Facet
degeneration most prominent on the right at C3-4 and C4-5. Minimal
disc space narrowing.

Upper chest: Negative

Other: None
IMPRESSION: No acute intracranial abnormality

Negative for cervical spine fracture

3.1 x 2.4 cm left thyroid nodule with coarse calcification. This may
represent benign or malignant nodule.

## 2018-05-25 IMAGING — DX DG PORTABLE PELVIS
1 series · 1 of 1 positions shown · non-contrast
Comparison: Preoperative pelvis radiograph revealing a subcapital
fracture of the left hip.

CLINICAL DATA: Post- operative left total hip arthroplasty

EXAM:
PORTABLE PELVIS 1-2 VIEWS

[pelvis ap]
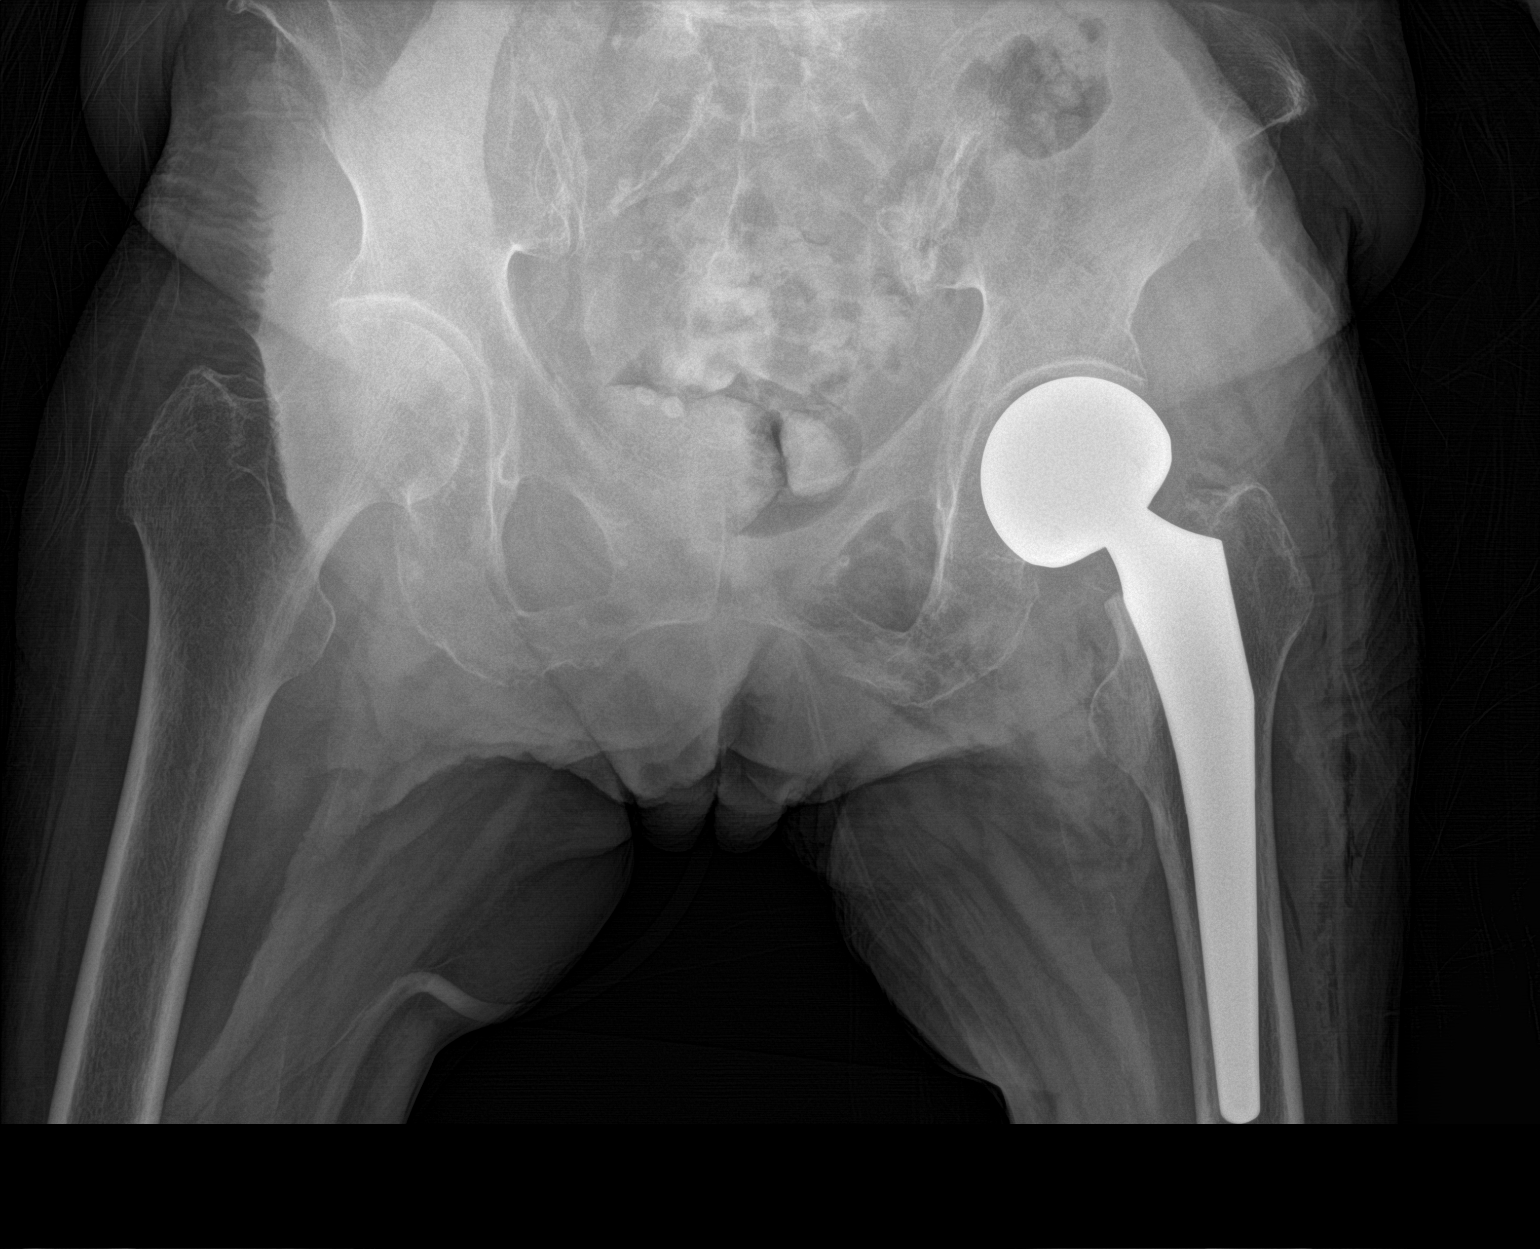

[1 of 1 positions shown; findings below may reference images not displayed]

FINDINGS: The bones are is subjectively osteopenic. There is a prosthetic left
hip joint. Radiographic positioning of the prosthetic components is
good. The interface with the native bone appears normal. No native
bone fracture is observed.
IMPRESSION: No immediate postprocedure complication following left hip joint
prosthesis placement.
